# Patient Record
Sex: Male | Born: 1965 | Race: White | Hispanic: No | Marital: Single | Smoking: Current every day smoker
Health system: Southern US, Community
[De-identification: ages and names within clinical notes are randomized; demographics above are authoritative.]

## PROBLEM LIST (undated history)

## (undated) HISTORY — PX: HERNIA REPAIR: SHX51

---

## 2016-07-23 ENCOUNTER — Ambulatory Visit: Payer: Self-pay | Admitting: Adult Health

## 2016-08-06 ENCOUNTER — Encounter: Payer: Self-pay | Admitting: Adult Health

## 2016-08-06 ENCOUNTER — Ambulatory Visit (INDEPENDENT_AMBULATORY_CARE_PROVIDER_SITE_OTHER): Payer: BLUE CROSS/BLUE SHIELD | Admitting: Adult Health

## 2016-08-06 VITALS — BP 120/80 | HR 87 | Resp 17 | Ht 73.75 in | Wt 178.0 lb

## 2016-08-06 DIAGNOSIS — Z720 Tobacco use: Secondary | ICD-10-CM | POA: Diagnosis not present

## 2016-08-06 DIAGNOSIS — M5431 Sciatica, right side: Secondary | ICD-10-CM

## 2016-08-06 DIAGNOSIS — R51 Headache: Secondary | ICD-10-CM

## 2016-08-06 DIAGNOSIS — H9191 Unspecified hearing loss, right ear: Secondary | ICD-10-CM

## 2016-08-06 DIAGNOSIS — H9190 Unspecified hearing loss, unspecified ear: Secondary | ICD-10-CM | POA: Insufficient documentation

## 2016-08-06 DIAGNOSIS — R519 Headache, unspecified: Secondary | ICD-10-CM

## 2016-08-06 DIAGNOSIS — K089 Disorder of teeth and supporting structures, unspecified: Secondary | ICD-10-CM | POA: Insufficient documentation

## 2016-08-06 DIAGNOSIS — R5383 Other fatigue: Secondary | ICD-10-CM

## 2016-08-06 DIAGNOSIS — K4090 Unilateral inguinal hernia, without obstruction or gangrene, not specified as recurrent: Secondary | ICD-10-CM

## 2016-08-06 MED ORDER — DULOXETINE HCL 60 MG PO CPEP: 60.0000 mg | ORAL_CAPSULE | Freq: Every day | ORAL | 0 refills | Status: DC

## 2016-08-06 NOTE — Progress Notes (Addendum)
Subjective:    Patient ID: Steven Briggs, male    DOB: 01/09/1966, 51 y.o.   MRN: [id number]  HPI:  Steven Briggs presents to establish as a new pt.  He is a pleasant 51 year old that severd 21 years in the [employer] as a [employer].  Medical hx includes HA, hearing loss, poor dentition (he has personally extracted his own teeth), left inguinal hernia, chronic back pain and tobacco use (1/2 pack for last 42 years).  He denies mental health issue or ETOH use.  He has had various procedures/screening completed at [facility] in [place], however none since moving to [place] in 2014.   Patient Care Team    Relationship Specialty Notifications Start End  Malon KindleKaty D Bess, NP PCP - General Family Medicine  08/06/16     Patient Active Problem List   Diagnosis Date Noted  . Back pain with right-sided sciatica 08/06/2016  . Headache 08/06/2016  . Tobacco use 08/06/2016  . Left inguinal hernia 08/06/2016  . Hearing loss 08/06/2016  . Poor dentition 08/06/2016     History reviewed. No pertinent past medical history.   Past Surgical History:  Procedure Laterality Date  . HERNIA REPAIR     2009 and 2012     Family History  Problem Relation Age of Onset  . Alcohol abuse Mother   . Stroke Mother     in her late 2480's  . Heart attack Father     in his 3850's  . Hypertension Father      History  Drug Use No     History  Alcohol Use No     History  Smoking Status  . Current Every Day Smoker  . Packs/day: 0.50  . Years: 42.00  . Types: Cigarettes  Smokeless Tobacco  . Never Used     Outpatient Encounter Prescriptions as of 08/06/2016  Medication Sig  . Acetaminophen-Caffeine (EXCEDRIN TENSION HEADACHE) 500-65 MG TABS Take by mouth.  . DULoxetine (CYMBALTA) 60 MG capsule Take 1 capsule (60 mg total) by mouth daily. Half tablet by mouth the first 7 days, then increase to full tablet daily.   No facility-administered encounter medications on file as of 08/06/2016.      Allergies: Patient has no known allergies.  Body mass index is 23.01 kg/m.  Blood pressure 120/80, pulse 87, resp. rate 17, height 6' 1.75" (1.873 m), weight 178 lb (80.7 kg), SpO2 100 %.     Review of Systems  Constitutional: Negative for activity change, appetite change, chills, diaphoresis, fatigue, fever and unexpected weight change.  HENT: Positive for dental problem, hearing loss and tinnitus. Negative for congestion, ear pain, sinus pain, trouble swallowing and voice change.        Personally extracted several teeth bottom right jaw.  Eyes: Negative for photophobia, pain, discharge, redness, itching and visual disturbance.  Respiratory: Negative for cough and shortness of breath.   Cardiovascular: Negative for chest pain and leg swelling.  Gastrointestinal: Negative for abdominal distention, abdominal pain, blood in stool, constipation, diarrhea, nausea and vomiting.  Endocrine: Negative for cold intolerance, heat intolerance, polydipsia, polyphagia and polyuria.  Genitourinary: Negative for difficulty urinating and flank pain.  Musculoskeletal: Positive for arthralgias, back pain and myalgias. Negative for gait problem, joint swelling, neck pain and neck stiffness.  Skin: Negative for color change, pallor, rash and wound.  Neurological: Positive for headaches. Negative for dizziness, tremors, speech difficulty, weakness and light-headedness.  Psychiatric/Behavioral: Negative for agitation, behavioral problems, confusion, self-injury, sleep disturbance  and suicidal ideas. The patient is not nervous/anxious.        Objective:   Physical Exam  Constitutional: He is oriented to person, place, and time. He appears well-developed and well-nourished. No distress.  HENT:  Head: Normocephalic and atraumatic.  Right Ear: Tympanic membrane, external ear and ear canal normal. Decreased hearing is noted.  Left Ear: Hearing, tympanic membrane, external ear and ear canal normal. No  decreased hearing is noted.  Mouth/Throat: He does not have dentures. No oral lesions. No trismus in the jaw. Abnormal dentition. No dental abscesses, uvula swelling or lacerations.    Missing teeth bottom right jaw.  No signs of infection noted.  Eyes: Conjunctivae and EOM are normal. Pupils are equal, round, and reactive to light.  Neck: Normal range of motion. Neck supple. No spinous process tenderness and no muscular tenderness present.    Once firm, mobile lymph node-been there > 5 years.  Cardiovascular: Normal rate, regular rhythm, normal heart sounds and intact distal pulses.   Pulmonary/Chest: Effort normal and breath sounds normal. No respiratory distress. He has no wheezes. He has no rales. He exhibits no tenderness.  Abdominal: Soft. Bowel sounds are normal. He exhibits no distension. There is no tenderness. There is no rebound and no guarding. A hernia is present. Hernia confirmed positive in the left inguinal area.  Musculoskeletal: Normal range of motion. He exhibits tenderness.       Right hip: He exhibits tenderness.       Lumbar back: He exhibits tenderness.  Neurological: He is alert and oriented to person, place, and time. He has normal reflexes.  Skin: Skin is warm and dry. No rash noted. He is not diaphoretic. No erythema. No pallor.  Psychiatric: He has a normal mood and affect. His behavior is normal. Judgment and thought content normal.  Nursing note and vitals reviewed.         Assessment & Plan:   1. Fatigue, unspecified type   2. Back pain with right-sided sciatica   3. Chronic nonintractable headache, unspecified headache type   4. Tobacco use   5. Left inguinal hernia   6. Hearing loss of right ear, unspecified hearing loss type   7. Poor dentition     Back pain with right-sided sciatica Continue daily stretching and use good body mechanics when lifting.   Begin taking 1/2 tablet Cymbalta for 7 days, then increase to full tablet daily. Return in 4  weeks to evaluate effectiveness of medication.  Hearing loss Avoid exposure loud noises. Audiologist referral placed.  Headache Increase water intake, reduce tobacco use.  Continue Excedrin as directed by manufacturer.  Tobacco use Declined smoking cessation at this time.   Left inguinal hernia Use good body mechanics when lifting.   Urology referral placed.  Poor dentition Daily brushing and flossing.  Decline information in local dental practice at encounter today.    FOLLOW-UP:  Return in about 1 month (around 09/03/2016) for Evaluate Medication Effectiveness.

## 2016-08-06 NOTE — Assessment & Plan Note (Signed)
Daily brushing and flossing.  Decline information in local dental practice at encounter today.

## 2016-08-06 NOTE — Assessment & Plan Note (Signed)
Declined smoking cessation at this time.  

## 2016-08-06 NOTE — Patient Instructions (Signed)
Back Pain, Adult Back pain is very common in adults.The cause of back pain is rarely dangerous and the pain often gets better over time.The cause of your back pain may not be known. Some common causes of back pain include:  Strain of the muscles or ligaments supporting the spine.  Wear and tear (degeneration) of the spinal disks.  Arthritis.  Direct injury to the back. For many people, back pain may return. Since back pain is rarely dangerous, most people can learn to manage this condition on their own. Follow these instructions at home: Watch your back pain for any changes. The following actions may help to lessen any discomfort you are feeling:  Remain active. It is stressful on your back to sit or stand in one place for long periods of time. Do not sit, drive, or stand in one place for more than 30 minutes at a time. Take short walks on even surfaces as soon as you are able.Try to increase the length of time you walk each day.  Exercise regularly as directed by your health care provider. Exercise helps your back heal faster. It also helps avoid future injury by keeping your muscles strong and flexible.  Do not stay in bed.Resting more than 1-2 days can delay your recovery.  Pay attention to your body when you bend and lift. The most comfortable positions are those that put less stress on your recovering back. Always use proper lifting techniques, including:  Bending your knees.  Keeping the load close to your body.  Avoiding twisting.  Find a comfortable position to sleep. Use a firm mattress and lie on your side with your knees slightly bent. If you lie on your back, put a pillow under your knees.  Avoid feeling anxious or stressed.Stress increases muscle tension and can worsen back pain.It is important to recognize when you are anxious or stressed and learn ways to manage it, such as with exercise.  Take medicines only as directed by your health care provider.  Over-the-counter medicines to reduce pain and inflammation are often the most helpful.Your health care provider may prescribe muscle relaxant drugs.These medicines help dull your pain so you can more quickly return to your normal activities and healthy exercise.  Apply ice to the injured area:  Put ice in a plastic bag.  Place a towel between your skin and the bag.  Leave the ice on for 20 minutes, 2-3 times a day for the first 2-3 days. After that, ice and heat may be alternated to reduce pain and spasms.  Maintain a healthy weight. Excess weight puts extra stress on your back and makes it difficult to maintain good posture. Contact a health care provider if:  You have pain that is not relieved with rest or medicine.  You have increasing pain going down into the legs or buttocks.  You have pain that does not improve in one week.  You have night pain.  You lose weight.  You have a fever or chills. Get help right away if:  You develop new bowel or bladder control problems.  You have unusual weakness or numbness in your arms or legs.  You develop nausea or vomiting.  You develop abdominal pain.  You feel faint. This information is not intended to replace advice given to you by your health care provider. Make sure you discuss any questions you have with your health care provider. Document Released: 06/03/2005 Document Revised: 10/12/2015 Document Reviewed: 10/05/2013 Elsevier Interactive Patient Education  2017 [employer]   Inc[employer]  Duloxetine delayed-release capsules What is this medicine? DULOXETINE (doo LOX e teen) is used to treat depression, anxiety, and different types of chronic pain. This medicine may be used for other purposes; ask your health care provider or pharmacist if you have questions. COMMON BRAND NAME(S): Cymbalta, [place] What should I tell my health care provider before I take this medicine? They need to know if you have any of these conditions: -bipolar  disorder or a family history of bipolar disorder -glaucoma -kidney disease -liver disease -suicidal thoughts or a previous suicide attempt -taken medicines called MAOIs like Carbex, Eldepryl, Marplan, Nardil, and Parnate within 14 days -an unusual reaction to duloxetine, other medicines, foods, dyes, or preservatives -pregnant or trying to get pregnant -breast-feeding How should I use this medicine? Take this medicine by mouth with a glass of water. Follow the directions on the prescription label. Do not cut, crush or chew this medicine. You can take this medicine with or without food. Take your medicine at regular intervals. Do not take your medicine more often than directed. Do not stop taking this medicine suddenly except upon the advice of your doctor. Stopping this medicine too quickly may cause serious side effects or your condition may worsen. A special MedGuide will be given to you by the pharmacist with each prescription and refill. Be sure to read this information carefully each time. Talk to your pediatrician regarding the use of this medicine in children. While this drug may be prescribed for children as young as 61 years of age for selected conditions, precautions do apply. Overdosage: If you think you have taken too much of this medicine contact a poison control center or emergency room at once. NOTE: This medicine is only for you. Do not share this medicine with others. What if I miss a dose? If you miss a dose, take it as soon as you can. If it is almost time for your next dose, take only that dose. Do not take double or extra doses. What may interact with this medicine? Do not take this medicine with any of the following medications: -desvenlafaxine -levomilnacipran -linezolid -MAOIs like Carbex, Eldepryl, Marplan, Nardil, and Parnate -methylene blue (injected into a vein) -milnacipran -thioridazine -venlafaxine This medicine may also interact with the following  medications: -alcohol -amphetamines -aspirin and aspirin-like medicines -certain antibiotics like ciprofloxacin and enoxacin -certain medicines for blood pressure, heart disease, irregular heart beat -certain medicines for depression, anxiety, or psychotic disturbances -certain medicines for migraine headache like almotriptan, eletriptan, frovatriptan, naratriptan, rizatriptan, sumatriptan, zolmitriptan -certain medicines that treat or prevent blood clots like warfarin, enoxaparin, and dalteparin -cimetidine -fentanyl -lithium -NSAIDS, medicines for pain and inflammation, like ibuprofen or naproxen -phentermine -procarbazine -rasagiline -sibutramine -St. John's wort -theophylline -tramadol -tryptophan This list may not describe all possible interactions. Give your health care provider a list of all the medicines, herbs, non-prescription drugs, or dietary supplements you use. Also tell them if you smoke, drink alcohol, or use illegal drugs. Some items may interact with your medicine. What should I watch for while using this medicine? Tell your doctor if your symptoms do not get better or if they get worse. Visit your doctor or health care professional for regular checks on your progress. Because it may take several weeks to see the full effects of this medicine, it is important to continue your treatment as prescribed by your doctor. Patients and their families should watch out for new or worsening thoughts of suicide or depression. Also watch out for sudden  changes in feelings such as feeling anxious, agitated, panicky, irritable, hostile, aggressive, impulsive, severely restless, overly excited and hyperactive, or not being able to sleep. If this happens, especially at the beginning of treatment or after a change in dose, call your health care professional. Bonita Quin may get drowsy or dizzy. Do not drive, use machinery, or do anything that needs mental alertness until you know how this medicine  affects you. Do not stand or sit up quickly, especially if you are an older patient. This reduces the risk of dizzy or fainting spells. Alcohol may interfere with the effect of this medicine. Avoid alcoholic drinks. This medicine can cause an increase in blood pressure. This medicine can also cause a sudden drop in your blood pressure, which may make you feel faint and increase the chance of a fall. These effects are most common when you first start the medicine or when the dose is increased, or during use of other medicines that can cause a sudden drop in blood pressure. Check with your doctor for instructions on monitoring your blood pressure while taking this medicine. Your mouth may get dry. Chewing sugarless gum or sucking hard candy, and drinking plenty of water may help. Contact your doctor if the problem does not go away or is severe. What side effects may I notice from receiving this medicine? Side effects that you should report to your doctor or health care professional as soon as possible: -allergic reactions like skin rash, itching or hives, swelling of the face, lips, or tongue -anxious -breathing problems -confusion -changes in vision -chest pain -confusion -elevated mood, decreased need for sleep, racing thoughts, impulsive behavior -eye pain -fast, irregular heartbeat -feeling faint or lightheaded, falls -feeling agitated, angry, or irritable -hallucination, loss of contact with reality -high blood pressure -loss of balance or coordination -palpitations -redness, blistering, peeling or loosening of the skin, including inside the mouth -restlessness, pacing, inability to keep still -seizures -stiff muscles -suicidal thoughts or other mood changes -trouble passing urine or change in the amount of urine -trouble sleeping -unusual bleeding or bruising -unusually weak or tired -vomiting -yellowing of the eyes or skin Side effects that usually do not require medical  attention (report to your doctor or health care professional if they continue or are bothersome): -change in sex drive or performance -change in appetite or weight -constipation -dizziness -dry mouth -headache -increased sweating -nausea -tired This list may not describe all possible side effects. Call your doctor for medical advice about side effects. You may report side effects to FDA at 1-800-FDA-1088. Where should I keep my medicine? Keep out of the reach of children. Store at room temperature between 20 and 25 degrees C (68 to 77 degrees F). Throw away any unused medicine after the expiration date. NOTE: This sheet is a summary. It may not cover all possible information. If you have questions about this medicine, talk to your doctor, pharmacist, or health care provider.  2017 Elsevier/Gold Standard (2015-11-02 18:16:03)   General Headache Without Cause A headache is pain or discomfort felt around the head or neck area. The specific cause of a headache may not be found. There are many causes and types of headaches. A few common ones are:  Tension headaches.  Migraine headaches.  Cluster headaches.  Chronic daily headaches. Follow these instructions at home: Watch your condition for any changes. Take these steps to help with your condition: Managing pain  Take over-the-counter and prescription medicines only as told by your health care provider.  Lie down in a dark, quiet room when you have a headache.  If directed, apply ice to the head and neck area:  Put ice in a plastic bag.  Place a towel between your skin and the bag.  Leave the ice on for 20 minutes, 2-3 times per day.  Use a heating pad or hot shower to apply heat to the head and neck area as told by your health care provider.  Keep lights dim if bright lights bother you or make your headaches worse. Eating and drinking  Eat meals on a regular schedule.  Limit alcohol use.  Decrease the amount of  caffeine you drink, or stop drinking caffeine. General instructions  Keep all follow-up visits as told by your health care provider. This is important.  Keep a headache journal to help find out what may trigger your headaches. For example, write down:  What you eat and drink.  How much sleep you get.  Any change to your diet or medicines.  Try massage or other relaxation techniques.  Limit stress.  Sit up straight, and do not tense your muscles.  Do not use tobacco products, including cigarettes, chewing tobacco, or e-cigarettes. If you need help quitting, ask your health care provider.  Exercise regularly as told by your health care provider.  Sleep on a regular schedule. Get 7-9 hours of sleep, or the amount recommended by your health care provider. Contact a health care provider if:  Your symptoms are not helped by medicine.  You have a headache that is different from the usual headache.  You have nausea or you vomit.  You have a fever. Get help right away if:  Your headache becomes severe.  You have repeated vomiting.  You have a stiff neck.  You have a loss of vision.  You have problems with speech.  You have pain in the eye or ear.  You have muscular weakness or loss of muscle control.  You lose your balance or have trouble walking.  You feel faint or pass out.  You have confusion. This information is not intended to replace advice given to you by your health care provider. Make sure you discuss any questions you have with your health care provider. Document Released: 06/03/2005 Document Revised: 11/09/2015 Document Reviewed: 09/26/2014 Elsevier Interactive Patient Education  2017 [employer] Inc.   Tobacco Use Disorder Tobacco use disorder (TUD) is a mental disorder. It is the long-term use of tobacco in spite of related health problems or difficulty with normal life activities. Tobacco is most commonly smoked as cigarettes and less commonly as cigars  or pipes. Smokeless chewing tobacco and snuff are also popular. People with TUD get a feeling of extreme pleasure (euphoria) from using tobacco and have a desire to use it again and again. Repeated use of tobacco can cause problems. The addictive effects of tobacco are due mainly tothe ingredient nicotine. Nicotine also causes a rush of adrenaline (epinephrine) in the body. This leads to increased blood pressure, heart rate, and breathing rate. These changes may cause problems for people with high blood pressure, weak hearts, or lung disease. High doses of nicotine in children and pets can lead to seizures and death. Tobacco contains a number of other unsafe chemicals. These chemicals are especially harmful when inhaled as smoke and can damage almost every organ in the body. Smokers live shorter lives than nonsmokers and are at risk of dying from a number of diseases and cancers. Tobacco smoke can also cause health  problems for nonsmokers (due to inhaling secondhand smoke). Smoking is also a fire hazard. TUD usually starts in the late teenage years and is most common in young adults between the ages of 76 and 25 years. People who start smoking earlier in life are more likely to continue smoking as adults. TUD is somewhat more common in men than women. People with TUD are at higher risk for using alcohol and other drugs of abuse. What increases the risk? Risk factors for TUD include:  Having family members with the disorder.  Being around people who use tobacco.  Having an existing mental health issue such as schizophrenia, depression, bipolar disorder, ADHD, or posttraumatic stress disorder (PTSD). What are the signs or symptoms? People with tobacco use disorder have two or more of the following signs and symptoms within 12 months:  Use of more tobacco over a longer period than intended.  Not able to cut down or control tobacco use.  A lot of time spent obtaining or using tobacco.  Strong  desire or urge to use tobacco (craving). Cravings may last for 6 months or longer after quitting.  Use of tobacco even when use leads to major problems at work, school, or home.  Use of tobacco even when use leads to relationship problems.  Giving up or cutting down on important life activities because of tobacco use.  Repeatedly using tobacco in situations where it puts you or others in physical danger, like smoking in bed.  Use of tobacco even when it is known that a physical or mental problem is likely related to tobacco use.  Physical problems are numerous and may include chronic bronchitis, emphysema, lung and other cancers, gum disease, high blood pressure, heart disease, and stroke.  Mental problems caused by tobacco may include difficulty sleeping and anxiety.  Need to use greater amounts of tobacco to get the same effect. This means you have developed a tolerance.  Withdrawal symptoms as a result of stopping or rapidly cutting back use. These symptoms may last a month or more after quitting and include the following:  Depressed, anxious, or irritable mood.  Difficulty concentrating.  Increased appetite.  Restlessness or trouble sleeping.  Use of tobacco to avoid withdrawal symptoms. How is this diagnosed? Tobacco use disorder is diagnosed by your health care provider. A diagnosis may be made by:  Your health care provider asking questions about your tobacco use and any problems it may be causing.  A physical exam.  Lab tests.  You may be referred to a mental health professional or addiction specialist. The severity of tobacco use disorder depends on the number of signs and symptoms you have:  Mild-Two or three symptoms.  Moderate-Four or five symptoms.  Severe-Six or more symptoms. How is this treated? Many people with tobacco use disorder are unable to quit on their own and need help. Treatment options include the following:  Nicotine replacement therapy  (NRT). NRT provides nicotine without the other harmful chemicals in tobacco. NRT gradually lowers the dosage of nicotine in the body and reduces withdrawal symptoms. NRT is available in over-the-counter forms (gum, lozenges, and skin patches) as well as prescription forms (mouth inhaler and nasal spray).  Medicines.This may include:  Antidepressant medicine that may reduce nicotine cravings.  A medicine that acts on nicotine receptors in the brain to reduce cravings and withdrawal symptoms. It may also block the effects of tobacco in people with TUD who relapse.  Counseling or talk therapy. A form of talk therapy  called behavioral therapy is commonly used to treat people with TUD. Behavioral therapy looks at triggers for tobacco use, how to avoid them, and how to cope with cravings. It is most effective in person or by phone but is also available in self-help forms (books and Internet websites).  Support groups. These provide emotional support, advice, and guidance for quitting tobacco. The most effective treatment for TUD is usually a combination of medicine, talk therapy, and support groups. Follow these instructions at home:  Keep all follow-up visits as directed by your health care provider. This is important.  Take medicines only as directed by your health care provider.  Check with your health care provider before starting new prescription or over-the-counter medicines. Contact a health care provider if:  You are not able to take your medicines as prescribed.  Treatment is not helping your TUD and your symptoms get worse. Get help right away if:  You have serious thoughts about hurting yourself or others.  You have trouble breathing, chest pain, sudden weakness, or sudden numbness in part of your body. This information is not intended to replace advice given to you by your health care provider. Make sure you discuss any questions you have with your health care provider. Document  Released: 02/07/2004 Document Revised: 02/04/2016 Document Reviewed: 07/30/2013 Elsevier Interactive Patient Education  2017 [employer] Inc.  Urology referral placed to address left inguinal hernia. Audiology referral placed to address chronic hearing loss. Continue stretching daily and use Cymbalta as directed. Please call us with results of Biometric screening from employer. Please return in 4 weeks to evaluate effectiveness of Cymbalta.

## 2016-08-06 NOTE — Assessment & Plan Note (Signed)
Increase water intake, reduce tobacco use.  Continue Excedrin as directed by manufacturer.

## 2016-08-06 NOTE — Assessment & Plan Note (Signed)
Use good body mechanics when lifting.   Urology referral placed.

## 2016-08-06 NOTE — Assessment & Plan Note (Signed)
Avoid exposure loud noises. Audiologist referral placed.

## 2016-08-06 NOTE — Assessment & Plan Note (Signed)
Continue daily stretching and use good body mechanics when lifting.   Begin taking 1/2 tablet Cymbalta for 7 days, then increase to full tablet daily. Return in 4 weeks to evaluate effectiveness of medication.

## 2016-08-07 LAB — CBC WITH DIFFERENTIAL/PLATELET
BASOS ABS: 0 10*3/uL (ref 0.0–0.2)
Basos: 0 %
EOS (ABSOLUTE): 0.1 10*3/uL (ref 0.0–0.4)
Eos: 2 %
HEMOGLOBIN: 14.7 g/dL (ref 13.0–17.7)
Hematocrit: 42.4 % (ref 37.5–51.0)
IMMATURE GRANS (ABS): 0 10*3/uL (ref 0.0–0.1)
Immature Granulocytes: 0 %
LYMPHS: 20 %
Lymphocytes Absolute: 1.6 10*3/uL (ref 0.7–3.1)
MCH: 30.1 pg (ref 26.6–33.0)
MCHC: 34.7 g/dL (ref 31.5–35.7)
MCV: 87 fL (ref 79–97)
MONOCYTES: 7 %
Monocytes Absolute: 0.6 10*3/uL (ref 0.1–0.9)
Neutrophils Absolute: 5.8 10*3/uL (ref 1.4–7.0)
Neutrophils: 71 %
Platelets: 224 10*3/uL (ref 150–379)
RBC: 4.88 x10E6/uL (ref 4.14–5.80)
RDW: 13 % (ref 12.3–15.4)
WBC: 8.2 10*3/uL (ref 3.4–10.8)

## 2016-08-07 LAB — BASIC METABOLIC PANEL
BUN/Creatinine Ratio: 15 (ref 9–20)
BUN: 11 mg/dL (ref 6–24)
CALCIUM: 9 mg/dL (ref 8.7–10.2)
CHLORIDE: 108 mmol/L — AB (ref 96–106)
CO2: 23 mmol/L (ref 18–29)
Creatinine, Ser: 0.72 mg/dL — ABNORMAL LOW (ref 0.76–1.27)
GFR calc non Af Amer: 109 (ref >59)
GFR, EST AFRICAN AMERICAN: 126 (ref >59)
GLUCOSE: 80 mg/dL
Glucose: 75 mg/dL (ref 65–99)
Potassium: 4.5 mmol/L (ref 3.5–5.2)
Sodium: 141 mmol/L (ref 134–144)

## 2016-08-07 LAB — LIPID PANEL
Cholesterol: 133 mg/dL (ref 0–200)
HDL: 35 mg/dL (ref 35–70)
LDL Cholesterol: 77 mg/dL
Triglycerides: 103 mg/dL (ref 40–160)

## 2016-08-08 LAB — VITAMIN D 25 HYDROXY (VIT D DEFICIENCY, FRACTURES): VIT D 25 HYDROXY: 19.3 ng/mL — AB (ref 30.0–100.0)

## 2016-08-08 LAB — SPECIMEN STATUS REPORT

## 2016-08-12 ENCOUNTER — Other Ambulatory Visit: Payer: Self-pay | Admitting: Adult Health

## 2016-08-12 DIAGNOSIS — E559 Vitamin D deficiency, unspecified: Secondary | ICD-10-CM

## 2016-08-12 MED ORDER — VITAMIN D (ERGOCALCIFEROL) 1.25 MG (50000 UNIT) PO CAPS: 50000.0000 [IU] | ORAL_CAPSULE | ORAL | 0 refills | Status: DC

## 2016-08-12 NOTE — Progress Notes (Unsigned)
Rx Ergocalciferol for 16 weeks, then will re-check Vit d level.

## 2016-09-03 ENCOUNTER — Ambulatory Visit (INDEPENDENT_AMBULATORY_CARE_PROVIDER_SITE_OTHER): Payer: BLUE CROSS/BLUE SHIELD | Admitting: Adult Health

## 2016-09-03 ENCOUNTER — Encounter: Payer: Self-pay | Admitting: Adult Health

## 2016-09-03 VITALS — BP 115/77 | HR 85 | Ht 76.0 in | Wt 179.0 lb

## 2016-09-03 DIAGNOSIS — M5431 Sciatica, right side: Secondary | ICD-10-CM

## 2016-09-03 MED ORDER — CYCLOBENZAPRINE HCL 10 MG PO TABS: 10.0000 mg | ORAL_TABLET | Freq: Every day | ORAL | 1 refills | Status: DC

## 2016-09-03 NOTE — Patient Instructions (Signed)

## 2016-09-03 NOTE — Assessment & Plan Note (Signed)
Wean off Duloxetine since it was not effective for pain control, take one tablet every other day until bottle empty. Continue daily stretching, hot baths. Cyclobenzaprine as needed QHS. RTC PRN.

## 2016-09-03 NOTE — Progress Notes (Signed)
Subjective:    Patient ID: Steven Briggs, male    DOB: 10/10/1965, 51 y.o.   MRN: [id number]  HPI:  [name] presents for f/u on Cymbalta treatment for chronic pain.  He reports taking the medication as directed and has not experienced any real reduction in overall pain.  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  His work partner is out on medical leave and he has "working all the job sites by myself" which increases his fatigue and chronic back pain.  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 He reports stretching BID, hot baths in evening and "sleeping on a heating pad".  He denies bowel/bladder dysfunction.  He was unable to keep his general surgery appt for hernia last week b/c he was unable to get off work (since his partner is out on medical leave).     Patient Care Team    Relationship Specialty Notifications Start End  Malon KindleKaty D Bess, NP PCP - General Family Medicine  08/06/16     Patient Active Problem List   Diagnosis Date Noted  . Back pain with right-sided sciatica 08/06/2016  . Headache 08/06/2016  . Tobacco use 08/06/2016  . Left inguinal hernia 08/06/2016  . Hearing loss 08/06/2016  . Poor dentition 08/06/2016     No past medical history on file.   Past Surgical History:  Procedure Laterality Date  . HERNIA REPAIR     2009 and 2012     Family History  Problem Relation Age of Onset  . Alcohol abuse Mother   . Stroke Mother     in her late 4480's  . Heart attack Father     in his 9050's  . Hypertension Father      History  Drug Use No     History  Alcohol Use No     History  Smoking Status  . Current Every Day Smoker  . Packs/day: 0.50  . Years: 42.00  . Types: Cigarettes  Smokeless Tobacco  . Never Used     Outpatient Encounter Prescriptions as of 09/03/2016   Medication Sig  . Acetaminophen-Caffeine (EXCEDRIN TENSION HEADACHE) 500-65 MG TABS Take by mouth.  . Vitamin D, Ergocalciferol, (DRISDOL) 50000 units CAPS capsule Take 1 capsule (50,000 Units total) by mouth every 7 (seven) days.  . [DISCONTINUED] DULoxetine (CYMBALTA) 60 MG capsule Take 1 capsule (60 mg total) by mouth daily. Half tablet by mouth the first 7 days, then increase to full tablet daily.  . cyclobenzaprine (FLEXERIL) 10 MG tablet Take 1 tablet (10 mg total) by mouth at bedtime. As needed for pain/muscle spasm.   No facility-administered encounter medications on file as of 09/03/2016.     Allergies: Patient has no known allergies.  Body mass index is 21.79 kg/m.  Blood pressure 115/77, pulse 85, height 6\' 4"  (1.93 m), weight 179 lb 0.6 oz (81.2 kg), SpO2 97 %.     Review of Systems  Constitutional: Positive for fatigue. Negative for activity change, appetite change, chills, diaphoresis, fever and unexpected weight change.  Respiratory: Negative for cough, chest tightness, shortness of breath, wheezing and stridor.        Continues to use tobacco daily.  Cardiovascular: Negative for chest pain, palpitations and leg swelling.  Gastrointestinal: Negative for abdominal distention, abdominal pain, blood in stool, constipation, diarrhea, nausea and  vomiting.  Endocrine: Negative for cold intolerance, heat intolerance, polydipsia, polyphagia and polyuria.  Genitourinary: Negative for difficulty urinating and flank pain.  Musculoskeletal: Positive for arthralgias, back pain, myalgias and neck stiffness. Negative for gait problem, joint swelling and neck pain.  Skin: Negative for color change, pallor, rash and wound.  Neurological: Negative for dizziness, tremors, weakness and headaches.  Hematological: Does not bruise/bleed easily.  Psychiatric/Behavioral: Positive for sleep disturbance.       Reports early waking.       Objective:   Physical Exam  Constitutional: He is  oriented to person, place, and time. He appears well-developed and well-nourished.  HENT:  Head: Normocephalic and atraumatic.  Right Ear: External ear normal.  Left Ear: External ear normal.  Eyes: Conjunctivae are normal. Pupils are equal, round, and reactive to light.  Neck: Normal range of motion. Neck supple.  Cardiovascular: Normal rate, regular rhythm, normal heart sounds and intact distal pulses.   Pulmonary/Chest: Effort normal and breath sounds normal. No respiratory distress. He has no wheezes. He has no rales. He exhibits no tenderness.  Musculoskeletal: Normal range of motion. He exhibits tenderness.       Thoracic back: He exhibits tenderness and spasm. He exhibits no edema and no deformity.       Lumbar back: He exhibits tenderness and pain.  Right lumbar more painful than left lumbar. Full flexion/extension/rotation of lumbar/thoracic back.  Lymphadenopathy:    He has no cervical adenopathy.  Neurological: He is alert and oriented to person, place, and time.  Skin: Skin is warm and dry. No rash noted. No erythema. No pallor.  Psychiatric: He has a normal mood and affect. His behavior is normal. Judgment and thought content normal.  Nursing note and vitals reviewed.         Assessment & Plan:   1. Back pain with right-sided sciatica     Back pain with right-sided sciatica Wean off Duloxetine since it was not effective for pain control, take one tablet every other day until bottle empty. Continue daily stretching, hot baths. Cyclobenzaprine as needed QHS. RTC PRN.    FOLLOW-UP:  Return if symptoms worsen or fail to improve.

## 2016-10-29 ENCOUNTER — Encounter: Payer: Self-pay | Admitting: Adult Health

## 2016-10-29 ENCOUNTER — Ambulatory Visit (INDEPENDENT_AMBULATORY_CARE_PROVIDER_SITE_OTHER): Payer: BLUE CROSS/BLUE SHIELD | Admitting: Adult Health

## 2016-10-29 VITALS — BP 127/82 | HR 80 | Temp 97.7°F | Ht 73.0 in | Wt 173.0 lb

## 2016-10-29 DIAGNOSIS — M5431 Sciatica, right side: Secondary | ICD-10-CM

## 2016-10-29 DIAGNOSIS — K4090 Unilateral inguinal hernia, without obstruction or gangrene, not specified as recurrent: Secondary | ICD-10-CM | POA: Diagnosis not present

## 2016-10-29 MED ORDER — HYDROCODONE-ACETAMINOPHEN 7.5-325 MG PO TABS: 1.0000 | ORAL_TABLET | Freq: Three times a day (TID) | ORAL | 0 refills | Status: DC | PRN

## 2016-10-29 MED ORDER — CYCLOBENZAPRINE HCL 10 MG PO TABS: 10.0000 mg | ORAL_TABLET | Freq: Every day | ORAL | 0 refills | Status: DC
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
## 2016-10-29 NOTE — Assessment & Plan Note (Signed)
Has seen surgeon and plans to have L inguinal hernia repair beginning of July 2018.

## 2016-10-29 NOTE — Patient Instructions (Signed)
Back Pain, Adult Back pain is very common in adults.The cause of back pain is rarely dangerous and the pain often gets better over time.The cause of your back pain may not be known. Some common causes of back pain include:  Strain of the muscles or ligaments supporting the spine.  Wear and tear (degeneration) of the spinal disks.  Arthritis.  Direct injury to the back. For many people, back pain may return. Since back pain is rarely dangerous, most people can learn to manage this condition on their own. Follow these instructions at home: Watch your back pain for any changes. The following actions may help to lessen any discomfort you are feeling:  Remain active. It is stressful on your back to sit or stand in one place for long periods of time. Do not sit, drive, or stand in one place for more than 30 minutes at a time. Take short walks on even surfaces as soon as you are able.Try to increase the length of time you walk each day.  Exercise regularly as directed by your health care provider. Exercise helps your back heal faster. It also helps avoid future injury by keeping your muscles strong and flexible.  Do not stay in bed.Resting more than 1-2 days can delay your recovery.  Pay attention to your body when you bend and lift. The most comfortable positions are those that put less stress on your recovering back. Always use proper lifting techniques, including:  Bending your knees.  Keeping the load close to your body.  Avoiding twisting.  Find a comfortable position to sleep. Use a firm mattress and lie on your side with your knees slightly bent. If you lie on your back, put a pillow under your knees.  Avoid feeling anxious or stressed.Stress increases muscle tension and can worsen back pain.It is important to recognize when you are anxious or stressed and learn ways to manage it, such as with exercise.  Take medicines only as directed by your health care provider.  Over-the-counter medicines to reduce pain and inflammation are often the most helpful.Your health care provider may prescribe muscle relaxant drugs.These medicines help dull your pain so you can more quickly return to your normal activities and healthy exercise.  Apply ice to the injured area:  Put ice in a plastic bag.  Place a towel between your skin and the bag.  Leave the ice on for 20 minutes, 2-3 times a day for the first 2-3 days. After that, ice and heat may be alternated to reduce pain and spasms.  Maintain a healthy weight. Excess weight puts extra stress on your back and makes it difficult to maintain good posture. Contact a health care provider if:  You have pain that is not relieved with rest or medicine.  You have increasing pain going down into the legs or buttocks.  You have pain that does not improve in one week.  You have night pain.  You lose weight.  You have a fever or chills. Get help right away if:  You develop new bowel or bladder control problems.  You have unusual weakness or numbness in your arms or legs.  You develop nausea or vomiting.  You develop abdominal pain.  You feel faint. This information is not intended to replace advice given to you by your health care provider. Make sure you discuss any questions you have with your health care provider. Document Released: 06/03/2005 Document Revised: 10/12/2015 Document Reviewed: 10/05/2013 Elsevier Interactive Patient Education  2017 Elsevier   Inc.    Back Exercises The following exercises strengthen the muscles that help to support the back. They also help to keep the lower back flexible. Doing these exercises can help to prevent back pain or lessen existing pain. If you have back pain or discomfort, try doing these exercises 2-3 times each day or as told by your health care provider. When the pain goes away, do them once each day, but increase the number of times that you repeat the steps for  each exercise (do more repetitions). If you do not have back pain or discomfort, do these exercises once each day or as told by your health care provider. Exercises Single Knee to Chest   Repeat these steps 3-5 times for each leg: 1. Lie on your back on a firm bed or the floor with your legs extended. 2. Bring one knee to your chest. Your other leg should stay extended and in contact with the floor. 3. Hold your knee in place by grabbing your knee or thigh. 4. Pull on your knee until you feel a gentle stretch in your lower back. 5. Hold the stretch for 10-30 seconds. 6. Slowly release and straighten your leg. Pelvic Tilt   Repeat these steps 5-10 times: 1. Lie on your back on a firm bed or the floor with your legs extended. 2. Bend your knees so they are pointing toward the ceiling and your feet are flat on the floor. 3. Tighten your lower abdominal muscles to press your lower back against the floor. This motion will tilt your pelvis so your tailbone points up toward the ceiling instead of pointing to your feet or the floor. 4. With gentle tension and even breathing, hold this position for 5-10 seconds. Cat-Cow   Repeat these steps until your lower back becomes more flexible: 1. Get into a hands-and-knees position on a firm surface. Keep your hands under your shoulders, and keep your knees under your hips. You may place padding under your knees for comfort. 2. Let your head hang down, and point your tailbone toward the floor so your lower back becomes rounded like the back of a cat. 3. Hold this position for 5 seconds. 4. Slowly lift your head and point your tailbone up toward the ceiling so your back forms a sagging arch like the back of a cow. 5. Hold this position for 5 seconds. Press-Ups   Repeat these steps 5-10 times: 1. Lie on your abdomen (face-down) on the floor. 2. Place your palms near your head, about shoulder-width apart. 3. While you keep your back as relaxed as possible  and keep your hips on the floor, slowly straighten your arms to raise the top half of your body and lift your shoulders. Do not use your back muscles to raise your upper torso. You may adjust the placement of your hands to make yourself more comfortable. 4. Hold this position for 5 seconds while you keep your back relaxed. 5. Slowly return to lying flat on the floor. Bridges   Repeat these steps 10 times: 1. Lie on your back on a firm surface. 2. Bend your knees so they are pointing toward the ceiling and your feet are flat on the floor. 3. Tighten your buttocks muscles and lift your buttocks off of the floor until your waist is at almost the same height as your knees. You should feel the muscles working in your buttocks and the back of your thighs. If you do not feel these muscles, slide  your feet 1-2 inches farther away from your buttocks. 4. Hold this position for 3-5 seconds. 5. Slowly lower your hips to the starting position, and allow your buttocks muscles to relax completely. If this exercise is too easy, try doing it with your arms crossed over your chest. Abdominal Crunches   Repeat these steps 5-10 times: 1. Lie on your back on a firm bed or the floor with your legs extended. 2. Bend your knees so they are pointing toward the ceiling and your feet are flat on the floor. 3. Cross your arms over your chest. 4. Tip your chin slightly toward your chest without bending your neck. 5. Tighten your abdominal muscles and slowly raise your trunk (torso) high enough to lift your shoulder blades a tiny bit off of the floor. Avoid raising your torso higher than that, because it can put too much stress on your low back and it does not help to strengthen your abdominal muscles. 6. Slowly return to your starting position. Back Lifts  Repeat these steps 5-10 times: 1. Lie on your abdomen (face-down) with your arms at your sides, and rest your forehead on the floor. 2. Tighten the muscles in your  legs and your buttocks. 3. Slowly lift your chest off of the floor while you keep your hips pressed to the floor. Keep the back of your head in line with the curve in your back. Your eyes should be looking at the floor. 4. Hold this position for 3-5 seconds. 5. Slowly return to your starting position. Contact a health care provider if:  Your back pain or discomfort gets much worse when you do an exercise.  Your back pain or discomfort does not lessen within 2 hours after you exercise. If you have any of these problems, stop doing these exercises right away. Do not do them again unless your health care provider says that you can. Get help right away if:  You develop sudden, severe back pain. If this happens, stop doing the exercises right away. Do not do them again unless your health care provider says that you can. This information is not intended to replace advice given to you by your health care provider. Make sure you discuss any questions you have with your health care provider. Document Released: 07/11/2004 Document Revised: 10/11/2015 Document Reviewed: 07/28/2014 Elsevier Interactive Patient Education  2017 [employer]  Take medications as directed. Instructed not to take Norco and Cyclobenzaprine together. Do not drive or drink alcohol when taking Norco. PT and Orthopedic referrals placed. Continue daily stretching, heating pad, and warm baths. Continue to use good body mechanics when lifting. Please call clinic with any questions/concerns.

## 2016-10-29 NOTE — Assessment & Plan Note (Addendum)
Short rx of Norco provided. DEA registry reviewed, no contraindications noted. Cyclobenzaprine refilled.  Do not drive or drink alcohol when taking Norco.  PT and Orthopedic referrals placed. Continue daily stretching, heating pad, and warm baths. Continue to use good body mechanics when lifting. Please call clinic with any questions/concerns.

## 2016-11-19 ENCOUNTER — Other Ambulatory Visit: Payer: Self-pay | Admitting: Adult Health

## 2016-11-19 MED ORDER — VITAMIN D (ERGOCALCIFEROL) 1.25 MG (50000 UNIT) PO CAPS: 50000.0000 [IU] | ORAL_CAPSULE | ORAL | 0 refills | Status: DC

## 2016-11-19 NOTE — Telephone Encounter (Signed)
Please review refill requests for approval.  Tiajuana Amass. Kasiya Burck, CMA

## 2016-11-19 NOTE — Telephone Encounter (Signed)
Patient is requesting a refill of the Flexeril, Norco, and Vit D sent to [facility].

## 2016-11-19 NOTE — Addendum Note (Signed)
Addended by: Stan HeadNELSON, TONYA S on: 11/19/2016 03:16 PM   Modules accepted: Orders

## 2016-11-19 NOTE — Telephone Encounter (Signed)
He has not completed ordered Xray, not been to see PT or orthopedic. RFs will not be provided for cyclobenzaprine or norco.

## 2016-11-20 NOTE — Telephone Encounter (Signed)
Pt informed.  Advised pt, per William HamburgerKaty Danford, that it is our office policy that we will only give one RX for narcotics EVER.  Advised pt that he needs to have xray completed and see ortho for evaluation.  Any further RXs for narcotics should be prescribed by ortho.  Pt expressed understanding.  Steven Briggs. Nelson, CMA

## 2016-12-17 ENCOUNTER — Ambulatory Visit (INDEPENDENT_AMBULATORY_CARE_PROVIDER_SITE_OTHER): Payer: Self-pay

## 2016-12-17 ENCOUNTER — Encounter (INDEPENDENT_AMBULATORY_CARE_PROVIDER_SITE_OTHER): Payer: Self-pay | Admitting: Orthopaedic Surgery

## 2016-12-17 ENCOUNTER — Ambulatory Visit (INDEPENDENT_AMBULATORY_CARE_PROVIDER_SITE_OTHER): Payer: BLUE CROSS/BLUE SHIELD | Admitting: Orthopaedic Surgery

## 2016-12-17 VITALS — BP 122/81 | HR 85 | Ht 76.0 in | Wt 166.0 lb

## 2016-12-17 DIAGNOSIS — G8929 Other chronic pain: Secondary | ICD-10-CM | POA: Diagnosis not present

## 2016-12-17 DIAGNOSIS — M5441 Lumbago with sciatica, right side: Secondary | ICD-10-CM

## 2016-12-17 NOTE — Progress Notes (Signed)
Office Visit Note   Patient: Steven Briggs           Date of Birth: 01/22/1966           MRN: [id number] Visit Date: 12/17/2016              Requested by: Julaine Fusianford, Katy D, NP [address] [place], [place] [id number] PCP: Julaine Fusianford, Katy D, NP   Assessment & Plan: Visit Diagnoses:  1. Chronic midline low back pain with right-sided sciatica     Plan: Patient had persistent symptoms for a year. He is physically active and in good shape with good muscle strength and tone but has persistent nerve root tension signs on the right side. I recommend proceeding with an MRI scan for evaluation due to sciatic notch tenderness positive straight leg raising on the right and also follow-up after MRI.  Follow-Up Instructions: No Follow-up on file.   Orders:  Orders Placed This Encounter  Procedures  . XR Lumbar Spine 2-3 Views   No orders of the defined types were placed in this encounter.     Procedures: No procedures performed   Clinical Data: No additional findings.   Subjective: Chief Complaint  Patient presents with  . Lower Back - Pain    HPI 51 year old male works for [employer] [place]. 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 He states he does this repetitively and did 74 in the last 3 months ended 11 last week. He states he has pain in his back was sitting originally was injured at work in July 2017. He's a continued symptoms has had plain radiographs no MRI. He does better when he standing. His pain gradually progresses during the day and he has pain that radiates from his right side into his right thigh stops laterally at the knee. He's been on different medication states it was not helpful.  Review of Systems  Constitutional: Negative for chills and diaphoresis.  HENT: Negative for ear discharge, ear pain and nosebleeds.   Eyes: Negative for discharge and visual disturbance.  Respiratory: Negative for  cough, choking and shortness of breath.   Cardiovascular: Negative for chest pain and palpitations.  Gastrointestinal: Negative for abdominal distention and abdominal pain.       Positive foraminal hernia which has repaired a coming up within the next week. Inguinal hernias on the left.  Endocrine: Negative for cold intolerance and heat intolerance.  Genitourinary: Negative for flank pain and hematuria.  Musculoskeletal: Positive for back pain.       On-the-job injury July 2017. Patient has not been able to make to physical therapy since he works until 5.  Skin: Negative for rash and wound.  Neurological: Negative for seizures and speech difficulty.  Hematological: Negative for adenopathy. Does not bruise/bleed easily.  Psychiatric/Behavioral: Negative for agitation and suicidal ideas.     Objective: Vital Signs: BP 122/81   Pulse 85   Ht 6\' 4"  (1.93 m)   Wt 166 lb (75.3 kg)   BMI 20.21 kg/m   Physical Exam  Constitutional: He is oriented to person, place, and time. He appears well-developed and well-nourished.  HENT:  Head: Normocephalic and atraumatic.  Eyes: EOM are normal. Pupils are equal, round, and reactive to light.  Neck: No tracheal deviation present. No thyromegaly present.  Cardiovascular: Normal rate.   Pulmonary/Chest: Effort normal. He has no wheezes.  Abdominal: Soft. Bowel sounds are normal.  Musculoskeletal:  Patient has pain  with straight leg raising on the right at 60. negative on the left. Some pain with positive compression. Reflexes are 1+ knee and ankle jerk. He is able to heel and toe walk. He has some sciatic notch tenderness. No rashes over exposed skin. Anterior tib EHL quads hip flexors are strong no hamstring weakness. No trochanteric bursal tenderness. No tenderness of trochanteric bursa. Slight tenderness over the peroneal nerve at the neck of the fibula. Normal hip range of motion. Knees reach full extension. No atrophy no synovitis no rash over  exposed skin.  Neurological: He is alert and oriented to person, place, and time.  Skin: Skin is warm and dry. Capillary refill takes less than 2 seconds.  Psychiatric: He has a normal mood and affect. His behavior is normal. Judgment and thought content normal.    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  Specialty Comments:  No specialty comments available.  Imaging: No results found.   PMFS History: Patient Active Problem List   Diagnosis Date Noted  . Back pain with right-sided sciatica 08/06/2016  . Headache 08/06/2016  . Tobacco use 08/06/2016  . Left inguinal hernia 08/06/2016  . Hearing loss 08/06/2016  . Poor dentition 08/06/2016   No past medical history on file.  Family History  Problem Relation Age of Onset  . Alcohol abuse Mother   . Stroke Mother        in her late 58's  . Heart attack Father        in his 25's  . Hypertension Father     Past Surgical History:  Procedure Laterality Date  . HERNIA REPAIR     2009 and 2012   Social History   Occupational History  . Not on file.   Social History Main Topics  . Smoking status: Current Every Day Smoker    Packs/day: 0.50    Years: 42.00    Types: Cigarettes  . Smokeless tobacco: Never Used  . Alcohol use No  . Drug use: No  . Sexual activity: Yes

## 2016-12-17 NOTE — Addendum Note (Signed)
Addended byPrescott Parma: [name] on: 12/17/2016 09:57 AM   Modules accepted: Orders

## 2016-12-28 ENCOUNTER — Ambulatory Visit
Admission: RE | Admit: 2016-12-28 | Discharge: 2016-12-28 | Disposition: A | Discharge status: Home / Self Care | Payer: BLUE CROSS/BLUE SHIELD | Source: Ambulatory Visit | Attending: Orthopaedic Surgery | Admitting: Orthopaedic Surgery

## 2016-12-28 DIAGNOSIS — M5441 Lumbago with sciatica, right side: Principal | ICD-10-CM

## 2016-12-28 DIAGNOSIS — G8929 Other chronic pain: Secondary | ICD-10-CM

## 2017-01-14 ENCOUNTER — Encounter (INDEPENDENT_AMBULATORY_CARE_PROVIDER_SITE_OTHER): Payer: Self-pay | Admitting: Orthopaedic Surgery

## 2017-01-14 ENCOUNTER — Ambulatory Visit (INDEPENDENT_AMBULATORY_CARE_PROVIDER_SITE_OTHER): Payer: BLUE CROSS/BLUE SHIELD | Admitting: Orthopaedic Surgery

## 2017-01-14 VITALS — BP 119/80 | HR 86 | Ht 76.0 in | Wt 160.0 lb

## 2017-01-14 DIAGNOSIS — M5431 Sciatica, right side: Secondary | ICD-10-CM | POA: Diagnosis not present

## 2017-01-14 NOTE — Progress Notes (Signed)
Office Visit Note   Patient: Steven Briggs           Date of Birth: 09/01/1965           MRN: [id number] Visit Date: 01/14/2017              Requested by: Julaine Fusianford, Katy D, NP [address] [place], [place] [id number] PCP: Julaine Fusianford, Katy D, NP   Assessment & Plan: Visit Diagnoses:  1. Back pain with right-sided sciatica     Plan: I discussed patient denies look for some work which is a little easier. He has some dehydration and desiccation of L4-5 and L5-S1 with very small right paracentral disc protrusion. He's artery looking for something less strenuous. 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 If he gets some significant increase we can consider a epidural injection. I think if he switches activities and he's doing less lifting he should have less problems. Pathophysiology discussed again copy of the report return when necessary.  Follow-Up Instructions: No Follow-up on file.   Orders:  No orders of the defined types were placed in this encounter.  No orders of the defined types were placed in this encounter.     Procedures: No procedures performed   Clinical Data: No additional findings.   Subjective: Chief Complaint  Patient presents with  . Lower Back - Pain, Follow-up    HPI 51 year old male returns states she still having back pain he states his primary care physician sending to get his pain medication from us. Of note he has a recent refill on 01/08/2017 of 30 tablets of oxycodone 5 which is coming from Dr. Abbey Chattersosenbower who did his hernia surgery. Prior that he got 40 tablets on 12/23/2016. 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 He has to [employer] refrigerators couches sleeper sofa was etc. Turning and twisting makes his back hurt. He denies bowel or bladder symptoms no fever chills. MRI scan has been obtained and is ready for review.  Review of Systems 14 point review of  systems is updated and is unchanged from 12/17/2016.   Objective: Vital Signs: BP 119/80   Pulse 86   Ht 6\' 4"  (1.93 m)   Wt 160 lb (72.6 kg)   BMI 19.48 kg/m   Physical Exam  Constitutional: He is oriented to person, place, and time. He appears well-developed and well-nourished.  HENT:  Head: Normocephalic and atraumatic.  Eyes: Pupils are equal, round, and reactive to light. EOM are normal.  Neck: No tracheal deviation present. No thyromegaly present.  Cardiovascular: Normal rate.   Pulmonary/Chest: Effort normal. He has no wheezes.  Abdominal: Soft. Bowel sounds are normal.  Musculoskeletal:  Normal heel toe gait he complains of discomfort with turning twisting bending. Good strength no isolated motor weakness sensory testing is normal hip and knee range of motion is normal. There is partial amputation finger which is well-healed. Good cervical range of motion.  Neurological: He is alert and oriented to person, place, and time.  Skin: Skin is warm and dry. Capillary refill takes less than 2 seconds.  Psychiatric: He has a normal mood and affect. His behavior is normal. Judgment and thought content normal.    Ortho Exam  Specialty Comments:  No specialty comments available.  Imaging: Study Result   CLINICAL DATA:  Right side low back pain radiating into the lateral aspect of the right thigh for 1 year. No  known injury.  EXAM: MRI LUMBAR SPINE WITHOUT CONTRAST  TECHNIQUE: Multiplanar, multisequence MR imaging of the lumbar spine was performed. No intravenous contrast was administered.  COMPARISON:  Two views lumbar spine performed at [facility] 12/17/2016.  FINDINGS: Segmentation:  Standard.  Alignment:  Maintained.  Vertebrae:  Height and signal are normal.  Conus medullaris: Extends to the L1 level and appears normal.  Paraspinal and other soft tissues: Negative.  Disc levels:  T11-12: The disc is desiccated and there is a minimal  bulge without central canal or foraminal stenosis.  T12-L1:  Negative.  L1-2:  Negative.  L2-3:  Negative.  L3-4:  Negative.  L4-5: The disc is desiccated with a small annular fissure in the right foramen. Minimal bulge is seen but the central canal and foramina are open.  L5-S1: Shallow right paracentral protrusion without central canal or foraminal stenosis.  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   Electronically Signed   By: Drusilla Kannerhomas  Dalessio M.D.   On: 12/28/2016 10:01       PMFS History: Patient Active Problem List   Diagnosis Date Noted  . Back pain with right-sided sciatica 08/06/2016  . Headache 08/06/2016  . Tobacco use 08/06/2016  . Left inguinal hernia 08/06/2016  . Hearing loss 08/06/2016  . Poor dentition 08/06/2016   No past medical history on file.  Family History  Problem Relation Age of Onset  . Alcohol abuse Mother   . Stroke Mother        in her late 4580's  . Heart attack Father        in his 7550's  . Hypertension Father     Past Surgical History:  Procedure Laterality Date  . HERNIA REPAIR     2009 and 2012   Social History   Occupational History  . Not on file.   Social History Main Topics  . Smoking status: Current Every Day Smoker    Packs/day: 0.50    Years: 42.00    Types: Cigarettes  . Smokeless tobacco: Never Used  . Alcohol use No  . Drug use: No  . Sexual activity: Yes

## 2017-02-05 ENCOUNTER — Ambulatory Visit (INDEPENDENT_AMBULATORY_CARE_PROVIDER_SITE_OTHER): Payer: BLUE CROSS/BLUE SHIELD | Admitting: Adult Health

## 2017-02-05 ENCOUNTER — Encounter: Payer: Self-pay | Admitting: Adult Health

## 2017-02-05 DIAGNOSIS — Z0000 Encounter for general adult medical examination without abnormal findings: Secondary | ICD-10-CM | POA: Diagnosis not present

## 2017-02-05 DIAGNOSIS — M5431 Sciatica, right side: Secondary | ICD-10-CM | POA: Diagnosis not present

## 2017-02-05 DIAGNOSIS — Z720 Tobacco use: Secondary | ICD-10-CM | POA: Diagnosis not present

## 2017-02-05 MED ORDER — CYCLOBENZAPRINE HCL 10 MG PO TABS: 10.0000 mg | ORAL_TABLET | Freq: Every day | ORAL | 0 refills | Status: DC

## 2017-02-05 NOTE — Assessment & Plan Note (Signed)
Down to pack every 1/2 week-GREAT!

## 2017-02-05 NOTE — Patient Instructions (Signed)
Back Exercises If you have pain in your back, do these exercises 2-3 times each day or as told by your doctor. When the pain goes away, do the exercises once each day, but repeat the steps more times for each exercise (do more repetitions). If you do not have pain in your back, do these exercises once each day or as told by your doctor. Exercises Single Knee to Chest  Do these steps 3-5 times in a row for each leg: 1. Lie on your back on a firm bed or the floor with your legs stretched out. 2. Bring one knee to your chest. 3. Hold your knee to your chest by grabbing your knee or thigh. 4. Pull on your knee until you feel a gentle stretch in your lower back. 5. Keep doing the stretch for 10-30 seconds. 6. Slowly let go of your leg and straighten it.  Pelvic Tilt  Do these steps 5-10 times in a row: 1. Lie on your back on a firm bed or the floor with your legs stretched out. 2. Bend your knees so they point up to the ceiling. Your feet should be flat on the floor. 3. Tighten your lower belly (abdomen) muscles to press your lower back against the floor. This will make your tailbone point up to the ceiling instead of pointing down to your feet or the floor. 4. Stay in this position for 5-10 seconds while you gently tighten your muscles and breathe evenly.  Cat-Cow  Do these steps until your lower back bends more easily: 1. Get on your hands and knees on a firm surface. Keep your hands under your shoulders, and keep your knees under your hips. You may put padding under your knees. 2. Let your head hang down, and make your tailbone point down to the floor so your lower back is round like the back of a cat. 3. Stay in this position for 5 seconds. 4. Slowly lift your head and make your tailbone point up to the ceiling so your back hangs low (sags) like the back of a cow. 5. Stay in this position for 5 seconds.  Press-Ups  Do these steps 5-10 times in a row: 1. Lie on your belly (face-down)  on the floor. 2. Place your hands near your head, about shoulder-width apart. 3. While you keep your back relaxed and keep your hips on the floor, slowly straighten your arms to raise the top half of your body and lift your shoulders. Do not use your back muscles. To make yourself more comfortable, you may change where you place your hands. 4. Stay in this position for 5 seconds. 5. Slowly return to lying flat on the floor.  Bridges  Do these steps 10 times in a row: 1. Lie on your back on a firm surface. 2. Bend your knees so they point up to the ceiling. Your feet should be flat on the floor. 3. Tighten your butt muscles and lift your butt off of the floor until your waist is almost as high as your knees. If you do not feel the muscles working in your butt and the back of your thighs, slide your feet 1-2 inches farther away from your butt. 4. Stay in this position for 3-5 seconds. 5. Slowly lower your butt to the floor, and let your butt muscles relax.  If this exercise is too easy, try doing it with your arms crossed over your chest. Belly Crunches  Do these steps 5-10 times in   a row: 1. Lie on your back on a firm bed or the floor with your legs stretched out. 2. Bend your knees so they point up to the ceiling. Your feet should be flat on the floor. 3. Cross your arms over your chest. 4. Tip your chin a little bit toward your chest but do not bend your neck. 5. Tighten your belly muscles and slowly raise your chest just enough to lift your shoulder blades a tiny bit off of the floor. 6. Slowly lower your chest and your head to the floor.  Back Lifts Do these steps 5-10 times in a row: 1. Lie on your belly (face-down) with your arms at your sides, and rest your forehead on the floor. 2. Tighten the muscles in your legs and your butt. 3. Slowly lift your chest off of the floor while you keep your hips on the floor. Keep the back of your head in line with the curve in your back. Look at  the floor while you do this. 4. Stay in this position for 3-5 seconds. 5. Slowly lower your chest and your face to the floor.  Contact a doctor if:  Your back pain gets a lot worse when you do an exercise.  Your back pain does not lessen 2 hours after you exercise. If you have any of these problems, stop doing the exercises. Do not do them again unless your doctor says it is okay. Get help right away if:  You have sudden, very bad back pain. If this happens, stop doing the exercises. Do not do them again unless your doctor says it is okay. This information is not intended to replace advice given to you by your health care provider. Make sure you discuss any questions you have with your health care provider. Document Released: 07/06/2010 Document Revised: 11/09/2015 Document Reviewed: 07/28/2014 Elsevier Interactive Patient Education  2018 Elsevier Inc.   Back Pain, Adult Back pain is very common in adults.The cause of back pain is rarely dangerous and the pain often gets better over time.The cause of your back pain may not be known. Some common causes of back pain include:  Strain of the muscles or ligaments supporting the spine.  Wear and tear (degeneration) of the spinal disks.  Arthritis.  Direct injury to the back.  For many people, back pain may return. Since back pain is rarely dangerous, most people can learn to manage this condition on their own. Follow these instructions at home: Watch your back pain for any changes. The following actions may help to lessen any discomfort you are feeling:  Remain active. It is stressful on your back to sit or stand in one place for long periods of time. Do not sit, drive, or stand in one place for more than 30 minutes at a time. Take short walks on even surfaces as soon as you are able.Try to increase the length of time you walk each day.  Exercise regularly as directed by your health care provider. Exercise helps your back heal  faster. It also helps avoid future injury by keeping your muscles strong and flexible.  Do not stay in bed.Resting more than 1-2 days can delay your recovery.  Pay attention to your body when you bend and lift. The most comfortable positions are those that put less stress on your recovering back. Always use proper lifting techniques, including: ? Bending your knees. ? Keeping the load close to your body. ? Avoiding twisting.  Find a comfortable position  to sleep. Use a firm mattress and lie on your side with your knees slightly bent. If you lie on your back, put a pillow under your knees.  Avoid feeling anxious or stressed.Stress increases muscle tension and can worsen back pain.It is important to recognize when you are anxious or stressed and learn ways to manage it, such as with exercise.  Take medicines only as directed by your health care provider. Over-the-counter medicines to reduce pain and inflammation are often the most helpful.Your health care provider may prescribe muscle relaxant drugs.These medicines help dull your pain so you can more quickly return to your normal activities and healthy exercise.  Apply ice to the injured area: ? Put ice in a plastic bag. ? Place a towel between your skin and the bag. ? Leave the ice on for 20 minutes, 2-3 times a day for the first 2-3 days. After that, ice and heat may be alternated to reduce pain and spasms.  Maintain a healthy weight. Excess weight puts extra stress on your back and makes it difficult to maintain good posture.  Contact a health care provider if:  You have pain that is not relieved with rest or medicine.  You have increasing pain going down into the legs or buttocks.  You have pain that does not improve in one week.  You have night pain.  You lose weight.  You have a fever or chills. Get help right away if:  You develop new bowel or bladder control problems.  You have unusual weakness or numbness in your  arms or legs.  You develop nausea or vomiting.  You develop abdominal pain.  You feel faint. This information is not intended to replace advice given to you by your health care provider. Make sure you discuss any questions you have with your health care provider. Document Released: 06/03/2005 Document Revised: 10/12/2015 Document Reviewed: 10/05/2013 Elsevier Interactive Patient Education  2017 Elsevier Inc.  Continue stretching/heating pad/Epson salt baths/OTC Advil Final cyclobenzaprine rx provided-if you require additional oral pain/muscle relaxer medication you will be referred to pain management. Please call your orthopedic specialist and schedule appt for injection therapy. Please schedule complete physical this fall. 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 FEEL BETTER!

## 2017-02-05 NOTE — Assessment & Plan Note (Signed)
One final Rx for Cyclobenzaprine 10mg  QHS provided. Will NOT be rx'g any more narcotics/muscle relaxers.  Controlled Substance Database verified- he received  Hydrocodone count 15 tabs on 10/29/16 Oxy count 40 tabs on 01/02/17 Oxy count 30 tabs on 01/08/17 If he requires any more oral pain control-referral to pain clinic. Advised to f/u with Ortho to proceed with injection therapy. Encouraged to find different employment, where strenuous manual labor is not required. Continue OTC Ibuprofen as directed my manufacturer. Continue daily stretching/heating pad/Epson Salt baths.

## 2017-02-05 NOTE — Assessment & Plan Note (Signed)
CPE this fall

## 2017-02-05 NOTE — Progress Notes (Signed)
Subjective:    Patient ID: Steven Briggs, male    DOB: 1965-08-21, 51 y.o.   MRN: [id number]  HPI:  Mr. Deal is here for continues back pain.  He has seen [facility] twice in July.   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 Ortho recommended finding employment that does not require strenuous manual labor-I agree! Ortho also offered injection therapy to help alleviate pain-I agree! He continues to treat pain with OTC Ibuprofen, stretching, heating pad, Epson Salt baths.  He has been off of work due to L Hernia surgical repair on 12/23/16, he is cleared to return to work without restriction per his surgeon 02/23/17.   He feels that his pain is constant ache/throb that will radiate to RLE especially when sitting for prolonged periods.  He was asked several times to rate pain and could not provide a number.  He denies bowel/bladder dysfunction.  Of Note:  [place] Controlled Substance Database verified- he received  Hydrocodone count 15 tabs on 10/29/16 Oxy count 40 tabs on 01/02/17 Oxy count 30 tabs on 01/08/17 We discussed that we will not be providing narcotic rx's. If he requires any more oral pain control-referral to pain clinic.  Review of Systems  Constitutional: Positive for activity change and fatigue. Negative for appetite change, chills, diaphoresis, fever and unexpected weight change.  Respiratory: Negative for cough, chest tightness, shortness of breath, wheezing and stridor.   Cardiovascular: Negative for chest pain, palpitations and leg swelling.  Gastrointestinal: Negative for abdominal distention, abdominal pain, blood in stool, constipation, diarrhea, nausea and vomiting.  Endocrine: Negative for cold intolerance, heat intolerance, polydipsia, polyphagia and polyuria.  Genitourinary: Negative for difficulty urinating and flank pain.   Musculoskeletal: Positive for arthralgias, back pain, gait problem, myalgias and neck stiffness. Negative for joint swelling and neck pain.  Skin: Negative for color change, pallor and wound.  Neurological: Negative for dizziness, tremors and weakness.  Hematological: Does not bruise/bleed easily.  Psychiatric/Behavioral: Positive for sleep disturbance. Negative for decreased concentration, dysphoric mood, hallucinations, self-injury and suicidal ideas. The patient is not nervous/anxious and is not hyperactive.        Objective:   Physical Exam  Constitutional: He is oriented to person, place, and time. He appears well-developed and well-nourished. No distress.  HENT:  Head: Normocephalic and atraumatic.  Right Ear: External ear normal.  Left Ear: External ear normal.  Eyes: Pupils are equal, round, and reactive to light. Conjunctivae are normal.  Cardiovascular: Normal rate, regular rhythm, normal heart sounds and intact distal pulses.   No murmur heard. Pulmonary/Chest: Breath sounds normal. No respiratory distress. He has no wheezes. He has no rales. He exhibits no tenderness.  Abdominal: Soft. Bowel sounds are normal. He exhibits no distension and no mass. There is no tenderness. There is no rebound and no guarding.  Musculoskeletal: Normal range of motion. He exhibits tenderness and deformity. He exhibits no edema.       Right hip: He exhibits normal range of motion, normal strength and no tenderness.       Left hip: He exhibits normal range of motion, normal strength and no tenderness.       Lumbar back: He exhibits tenderness, deformity, pain and spasm. He exhibits normal range of motion and no bony tenderness.  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 Lumbar back-normal ROM, however causes him moderate discomfort. Positive R leg raise.  Neurological: He is  alert and oriented to person, place, and time. Coordination normal.  Skin: Skin is warm and dry. No rash noted. He is not  diaphoretic. No erythema. No pallor.  Psychiatric: He has a normal mood and affect. His behavior is normal. Judgment and thought content normal.  Nursing note and vitals reviewed.         Assessment & Plan:   1. Back pain with right-sided sciatica   2. Tobacco use   3. Healthcare maintenance     Back pain with right-sided sciatica One final Rx for Cyclobenzaprine 10mg  QHS provided. Will NOT be rx'g any more narcotics/muscle relaxers. [place] Controlled Substance Database verified- he received  Hydrocodone count 15 tabs on 10/29/16 Oxy count 40 tabs on 01/02/17 Oxy count 30 tabs on 01/08/17 If he requires any more oral pain control-referral to pain clinic. Advised to f/u with Ortho to proceed with injection therapy. Encouraged to find different employment, where strenuous manual labor is not required. Continue OTC Ibuprofen as directed my manufacturer. Continue daily stretching/heating pad/Epson Salt baths.  Tobacco use Down to pack every 1/2 week-GREAT!  Healthcare maintenance CPE this fall    FOLLOW-UP:  Return in about 3 months (around 05/08/2017) for CPE.

## 2017-05-21 ENCOUNTER — Ambulatory Visit (INDEPENDENT_AMBULATORY_CARE_PROVIDER_SITE_OTHER): Payer: Self-pay | Admitting: Adult Health

## 2017-05-21 ENCOUNTER — Encounter: Payer: Self-pay | Admitting: Adult Health

## 2017-05-21 ENCOUNTER — Ambulatory Visit: Payer: Self-pay

## 2017-05-21 VITALS — BP 112/62 | HR 60 | Temp 98.5°F | Ht 76.0 in | Wt 162.5 lb

## 2017-05-21 DIAGNOSIS — M79672 Pain in left foot: Secondary | ICD-10-CM

## 2017-05-21 DIAGNOSIS — G5762 Lesion of plantar nerve, left lower limb: Secondary | ICD-10-CM | POA: Insufficient documentation

## 2017-05-21 DIAGNOSIS — H9201 Otalgia, right ear: Secondary | ICD-10-CM

## 2017-05-21 MED ORDER — CIPROFLOXACIN-DEXAMETHASONE 0.3-0.1 % OT SUSP: 4.0000 [drp] | Freq: Two times a day (BID) | OTIC | 0 refills | Status: DC

## 2017-05-21 MED ORDER — MELOXICAM 7.5 MG PO TABS: 7.5000 mg | ORAL_TABLET | Freq: Two times a day (BID) | ORAL | 0 refills | Status: DC | PRN

## 2017-05-21 NOTE — Assessment & Plan Note (Signed)
Ear flushed, cerumen removed. Ciprodex ear gtt's provided

## 2017-05-21 NOTE — Progress Notes (Signed)
Subjective:    Patient ID: Steven Briggs, male    DOB: 12/12/1965, 51 y.o.   MRN: [id number]  HPI:  [name] presents with two acute compliants: 1) R ear pain when he tries to insert his ear plugs.  Pain 10/10, described as pressure and started 2 days ago. He recently had hearing test and hearing loss was detected, R worse than L He reports thick/clear nasal drainage for the last several weeks. 2) L foot pain and swelling that started > 1 week ago.  He denies acute injury/accident prior to onset of pain.  Pain is shooting and worsened with prolonged standing, walking.  He has purchased inserts, and been soaking in Epson salt bath nightly with some minor relief.   He denies either of these issues happening previously.  Patient Care Team    Relationship Specialty Notifications Start End  Julaine Fusianford, Geraldy Akridge D, NP PCP - General Family Medicine  08/06/16     Patient Active Problem List   Diagnosis Date Noted  . Left foot pain 05/21/2017  . Ear pain, right 05/21/2017  . 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 Healthcare maintenance 02/05/2017  . Back pain with right-sided sciatica 08/06/2016  . Headache 08/06/2016  . Tobacco use 08/06/2016  . Left inguinal hernia 08/06/2016  . Hearing loss 08/06/2016  . Poor dentition 08/06/2016     History reviewed. No pertinent past medical history.   Past Surgical History:  Procedure Laterality Date  . HERNIA REPAIR     2009 and 2012     Family History  Problem Relation Age of Onset  . Alcohol abuse Mother   . Stroke Mother        in her late 7780's  . Heart attack Father        in his 6750's  . Hypertension Father      Social History   Substance and Sexual Activity  Drug Use No     Social History   Substance and Sexual Activity  Alcohol Use No     Social History   Tobacco Use  Smoking Status Current Every Day Smoker  . Packs/day: 0.50  . Years: 42.00  . Pack years: 21.00  . Types: Cigarettes  Smokeless Tobacco Never  Used     Outpatient Encounter Medications as of 05/21/2017  Medication Sig  . Acetaminophen-Caffeine (EXCEDRIN TENSION HEADACHE) 500-65 MG TABS Take by mouth.  . ciprofloxacin-dexamethasone (CIPRODEX) OTIC suspension Place 4 drops into the right ear 2 (two) times daily.  . meloxicam (MOBIC) 7.5 MG tablet Take 1 tablet (7.5 mg total) by mouth 2 (two) times daily as needed for pain.  . [DISCONTINUED] cyclobenzaprine (FLEXERIL) 10 MG tablet Take 1 tablet (10 mg total) by mouth at bedtime.   No facility-administered encounter medications on file as of 05/21/2017.     Allergies: Patient has no known allergies.  Body mass index is 19.78 kg/m.  Blood pressure 112/62, pulse 60, temperature 98.5 F (36.9 C), temperature source Oral, height 6\' 4"  (1.93 m), weight 162 lb 8 oz (73.7 kg).     Review of Systems  Constitutional: Positive for activity change and fatigue. Negative for appetite change, chills, diaphoresis, fever and unexpected weight change.  HENT: Negative for congestion, postnasal drip and sinus pressure.   Eyes: Negative for visual disturbance.  Respiratory: Negative for cough, chest tightness, shortness of breath, wheezing and stridor.   Cardiovascular: Negative for chest pain, palpitations and leg swelling.  Musculoskeletal: Negative for arthralgias, back pain, gait problem, joint  swelling and myalgias.  Hematological: Does not bruise/bleed easily.       Objective:   Physical Exam  Constitutional: He is oriented to person, place, and time. He appears well-developed and well-nourished. No distress.  HENT:  Head: Normocephalic and atraumatic.  Right Ear: External ear normal. Tympanic membrane is not erythematous. Decreased hearing is noted.  Left Ear: External ear normal. Tympanic membrane is not erythematous. Decreased hearing is noted.  Nose: Mucosal edema and rhinorrhea present. Right sinus exhibits no maxillary sinus tenderness and no frontal sinus tenderness. Left  sinus exhibits no maxillary sinus tenderness and no frontal sinus tenderness.  Mouth/Throat: Uvula is midline, oropharynx is clear and moist and mucous membranes are normal. Abnormal dentition. Dental caries present. No oropharyngeal exudate or tonsillar abscesses.  Eyes: Conjunctivae are normal. Pupils are equal, round, and reactive to light.  Neck: Normal range of motion. Neck supple.  Cardiovascular: Normal rate, regular rhythm, normal heart sounds and intact distal pulses.  No murmur heard. Pulmonary/Chest: Effort normal and breath sounds normal. No respiratory distress. He has no wheezes. He has no rales. He exhibits no tenderness.  Musculoskeletal: He exhibits edema and tenderness.       Left foot: There is tenderness, bony tenderness and swelling.       Feet:  1 discrete, circular mass noted at base of 1st metatarsal  Lymphadenopathy:    He has no cervical adenopathy.  Neurological: He is alert and oriented to person, place, and time.  Skin: Skin is warm. No rash noted. He is not diaphoretic. No erythema. No pallor.  Psychiatric: He has a normal mood and affect. His behavior is normal. Judgment and thought content normal.          Assessment & Plan:   1. Left foot pain   2. Ear pain, right   3. Morton's neuralgia, left     Ear pain, right Ear flushed, cerumen removed. Ciprodex ear gtt's provided  Morton's neuralgia, left L foot Xray: IMPRESSION: No acute osseous injury of the left foot. Rest, ice, elevate left foot. Please use Meloxicam as directed and take with food. Referral to podiatry placed. Please call clinic with any questions/concerns.  Left foot pain L foot Xray: IMPRESSION: No acute osseous injury of the left foot.    FOLLOW-UP:  Return if symptoms worsen or fail to improve.

## 2017-05-21 NOTE — Patient Instructions (Signed)
Earache, Adult An earache, or ear pain, can be caused by many things, including:  An infection.  Ear wax buildup.  Ear pressure.  Something in the ear that should not be there (foreign body).  A sore throat.  Tooth problems.  Jaw problems.  Treatment of the earache will depend on the cause. If the cause is not clear or cannot be determined, you may need to watch your symptoms until your earache goes away or until a cause is found. Follow these instructions at home: Pay attention to any changes in your symptoms. Take these actions to help with your pain:  Take or apply over-the-counter and prescription medicines only as told by your health care provider.  If you were prescribed an antibiotic medicine, use it as told by your health care provider. Do not stop using the antibiotic even if you start to feel better.  Do not put anything in your ear other than medicine that is prescribed by your health care provider.  If directed, apply heat to the affected area as often as told by your health care provider. Use the heat source that your health care provider recommends, such as a moist heat pack or a heating pad. ? Place a towel between your skin and the heat source. ? Leave the heat on for 20-30 minutes. ? Remove the heat if your skin turns bright red. This is especially important if you are unable to feel pain, heat, or cold. You may have a greater risk of getting burned.  If directed, put ice on the ear: ? Put ice in a plastic bag. ? Place a towel between your skin and the bag. ? Leave the ice on for 20 minutes, 2-3 times a day.  Try resting in an upright position instead of lying down. This may help to reduce pressure in your ear and relieve pain.  Chew gum if it helps to relieve your ear pain.  Treat any allergies as told by your health care provider.  Keep all follow-up visits as told by your health care provider. This is important.  Contact a health care provider  if:  Your pain does not improve within 2 days.  Your earache gets worse.  You have new symptoms.  You have a fever. Get help right away if:  You have a severe headache.  You have a stiff neck.  You have trouble swallowing.  You have redness or swelling behind your ear.  You have fluid or blood coming from your ear.  You have hearing loss.  You feel dizzy. This information is not intended to replace advice given to you by your health care provider. Make sure you discuss any questions you have with your health care provider. Document Released: 01/19/2004 Document Revised: 01/30/2016 Document Reviewed: 11/27/2015 Elsevier Interactive Patient Education  2018 [employer].  Gwenevere AbbotMorton Neuralgia Gwenevere AbbotMorton neuralgia is a type of foot pain in the area closest to your toes. This area is sometimes called the ball of your foot. Morton neuralgia occurs when a branch of a nerve in your foot (digital nerve) becomes compressed. When this happens over a long period of time, the nerve can thicken (neuroma) and cause pain. This usually occurs between the third and fourth toe. Morton neuralgia can come and go but may get worse over time. What are the causes? Your digital nerve can become compressed and stretched at a point where it passes under a thick band of tissue that connects your toes (intermetatarsal ligament). Morton neuralgia can  be caused by mild repetitive damage in this area. This type of damage can result from:  Activities such as running or jumping.  Wearing shoes that are too tight.  What increases the risk? You may be at risk for Morton neuralgia if you:  Are male.  Wear high heels.  Wear shoes that are narrow or tight.  Participate in activities that stretch your toes. These include: ? Running. ? Ballet. ? Long-distance walking.  What are the signs or symptoms? The first symptom of Morton neuralgia is pain that spreads from the ball of your foot to your toes. It may  feel like you are walking on a marble. Pain usually gets worse with walking and goes away at night. Other symptoms may include numbness and cramping of your toes. How is this diagnosed? Your health care provider will do a physical exam. When doing the exam, your health care provider may:  Squeeze your foot just behind your toe.  Ask you to move your toes to check for pain.  You may also have tests on your foot to confirm the diagnosis. These may include:  An X-ray.  An MRI.  How is this treated? Treatment for Morton neuralgia may be as simple as changing the kind of shoes you wear. Other treatments may include:  Wearing a supportive pad (orthosis) under the front of your foot. This lifts your toe bones and takes pressure off the nerve.  Getting injections of numbing medicine and anti-inflammatory medicine (steroid) in the nerve.  Having surgery to remove part of the thickened nerve.  Follow these instructions at home:  Take medicine only as directed by your health care provider.  Wear soft-soled shoes with a wide toe area.  Stop activities that may be causing pain.  Elevate your foot when resting.  Massage your foot.  Apply ice to the injured area: ? Put ice in a plastic bag. ? Place a towel between your skin and the bag. ? Leave the ice on for 20 minutes, 2-3 times a day.  Keep all follow-up visits as directed by your health care provider. This is important. Contact a health care provider if:  Home care instructions are not helping you get better.  Your symptoms change or get worse. This information is not intended to replace advice given to you by your health care provider. Make sure you discuss any questions you have with your health care provider. Document Released: 09/09/2000 Document Revised: 11/09/2015 Document Reviewed: 08/04/2013 Elsevier Interactive Patient Education  2018 [employer]  Rest, ice, elevate left foot. Please use Meloxicam as directed and  take with food. Referral to podiatry placed. Please use ear drops as directed. Please call clinic with any questions/concerns. FEEL BETTER!

## 2017-05-21 NOTE — Assessment & Plan Note (Addendum)
L foot Xray: IMPRESSION: No acute osseous injury of the left foot. Rest, ice, elevate left foot. Please use Meloxicam as directed and take with food. Referral to podiatry placed. Please call clinic with any questions/concerns.

## 2017-05-26 ENCOUNTER — Other Ambulatory Visit: Payer: Self-pay | Admitting: Adult Health

## 2017-05-26 DIAGNOSIS — M5431 Sciatica, right side: Secondary | ICD-10-CM

## 2017-05-26 NOTE — Assessment & Plan Note (Signed)
L foot Xray: IMPRESSION: No acute osseous injury of the left foot.

## 2018-03-19 ENCOUNTER — Encounter: Payer: Self-pay | Admitting: Adult Health

## 2018-03-19 ENCOUNTER — Ambulatory Visit: Payer: BLUE CROSS/BLUE SHIELD | Admitting: Adult Health

## 2018-03-19 VITALS — BP 101/62 | HR 72 | Temp 98.5°F | Ht 76.0 in | Wt 163.3 lb

## 2018-03-19 DIAGNOSIS — J40 Bronchitis, not specified as acute or chronic: Secondary | ICD-10-CM

## 2018-03-19 DIAGNOSIS — Z0000 Encounter for general adult medical examination without abnormal findings: Secondary | ICD-10-CM

## 2018-03-19 DIAGNOSIS — Z1211 Encounter for screening for malignant neoplasm of colon: Secondary | ICD-10-CM

## 2018-03-19 DIAGNOSIS — N529 Male erectile dysfunction, unspecified: Secondary | ICD-10-CM

## 2018-03-19 MED ORDER — DOXYCYCLINE HYCLATE 100 MG PO TABS: 100.0000 mg | ORAL_TABLET | Freq: Two times a day (BID) | ORAL | 0 refills | Status: DC

## 2018-03-19 MED ORDER — FLUTICASONE PROPIONATE 50 MCG/ACT NA SUSP: 2.0000 | Freq: Every day | NASAL | 6 refills | Status: DC

## 2018-03-19 MED ORDER — HYDROCOD POLST-CPM POLST ER 10-8 MG/5ML PO SUER: 5.0000 mL | Freq: Two times a day (BID) | ORAL | 0 refills | Status: DC | PRN

## 2018-03-19 MED ORDER — LOPERAMIDE HCL 2 MG PO CAPS: 2.0000 mg | ORAL_CAPSULE | ORAL | 0 refills | Status: DC | PRN

## 2018-03-19 NOTE — Patient Instructions (Addendum)
Acute Bronchitis, Adult Acute bronchitis is sudden (acute) swelling of the air tubes (bronchi) in the lungs. Acute bronchitis causes these tubes to fill with mucus, which can make it hard to breathe. It can also cause coughing or wheezing. In adults, acute bronchitis usually goes away within 2 weeks. A cough caused by bronchitis may last up to 3 weeks. Smoking, allergies, and asthma can make the condition worse. Repeated episodes of bronchitis may cause further lung problems, such as chronic obstructive pulmonary disease (COPD). What are the causes? This condition can be caused by germs and by substances that irritate the lungs, including:  Cold and flu viruses. This condition is most often caused by the same virus that causes a cold.  Bacteria.  Exposure to tobacco smoke, dust, fumes, and air pollution.  What increases the risk? This condition is more likely to develop in people who:  Have close contact with someone with acute bronchitis.  Are exposed to lung irritants, such as tobacco smoke, dust, fumes, and vapors.  Have a weak immune system.  Have a respiratory condition such as asthma.  What are the signs or symptoms? Symptoms of this condition include:  A cough.  Coughing up clear, yellow, or green mucus.  Wheezing.  Chest congestion.  Shortness of breath.  A fever.  Body aches.  Chills.  A sore throat.  How is this diagnosed? This condition is usually diagnosed with a physical exam. During the exam, your health care provider may order tests, such as chest X-rays, to rule out other conditions. He or she may also:  Test a sample of your mucus for bacterial infection.  Check the level of oxygen in your blood. This is done to check for pneumonia.  Do a chest X-ray or lung function testing to rule out pneumonia and other conditions.  Perform blood tests.  Your health care provider will also ask about your symptoms and medical history. How is this  treated? Most cases of acute bronchitis clear up over time without treatment. Your health care provider may recommend:  Drinking more fluids. Drinking more makes your mucus thinner, which may make it easier to breathe.  Taking a medicine for a fever or cough.  Taking an antibiotic medicine.  Using an inhaler to help improve shortness of breath and to control a cough.  Using a cool mist vaporizer or humidifier to make it easier to breathe.  Follow these instructions at home: Medicines  Take over-the-counter and prescription medicines only as told by your health care provider.  If you were prescribed an antibiotic, take it as told by your health care provider. Do not stop taking the antibiotic even if you start to feel better. General instructions  Get plenty of rest.  Drink enough fluids to keep your urine clear or pale yellow.  Avoid smoking and secondhand smoke. Exposure to cigarette smoke or irritating chemicals will make bronchitis worse. If you smoke and you need help quitting, ask your health care provider. Quitting smoking will help your lungs heal faster.  Use an inhaler, cool mist vaporizer, or humidifier as told by your health care provider.  Keep all follow-up visits as told by your health care provider. This is important. How is this prevented? To lower your risk of getting this condition again:  Wash your hands often with soap and water. If soap and water are not available, use hand sanitizer.  Avoid contact with people who have cold symptoms.  Try not to touch your hands to your   mouth, nose, or eyes.  Make sure to get the flu shot every year.  Contact a health care provider if:  Your symptoms do not improve in 2 weeks of treatment. Get help right away if:  You cough up blood.  You have chest pain.  You have severe shortness of breath.  You become dehydrated.  You faint or keep feeling like you are going to faint.  You keep vomiting.  You have a  severe headache.  Your fever or chills gets worse. This information is not intended to replace advice given to you by your health care provider. Make sure you discuss any questions you have with your health care provider. Document Released: 07/11/2004 Document Revised: 12/27/2015 Document Reviewed: 11/22/2015 Elsevier Interactive Patient Education  2018 [employer].   Sinusitis, Adult Sinusitis is soreness and inflammation of your sinuses. Sinuses are hollow spaces in the bones around your face. Your sinuses are located:  Around your eyes.  In the middle of your forehead.  Behind your nose.  In your cheekbones.  Your sinuses and nasal passages are lined with a stringy fluid (mucus). Mucus normally drains out of your sinuses. When your nasal tissues become inflamed or swollen, the mucus can become trapped or blocked so air cannot flow through your sinuses. This allows bacteria, viruses, and funguses to grow, which leads to infection. Sinusitis can develop quickly and last for 7?10 days (acute) or for more than 12 weeks (chronic). Sinusitis often develops after a cold. What are the causes? This condition is caused by anything that creates swelling in the sinuses or stops mucus from draining, including:  Allergies.  Asthma.  Bacterial or viral infection.  Abnormally shaped bones between the nasal passages.  Nasal growths that contain mucus (nasal polyps).  Narrow sinus openings.  Pollutants, such as chemicals or irritants in the air.  A foreign object stuck in the nose.  A fungal infection. This is rare.  What increases the risk? The following factors may make you more likely to develop this condition:  Having allergies or asthma.  Having had a recent cold or respiratory tract infection.  Having structural deformities or blockages in your nose or sinuses.  Having a weak immune system.  Doing a lot of swimming or diving.  Overusing nasal  sprays.  Smoking.  What are the signs or symptoms? The main symptoms of this condition are pain and a feeling of pressure around the affected sinuses. Other symptoms include:  Upper toothache.  Earache.  Headache.  Bad breath.  Decreased sense of smell and taste.  A cough that may get worse at night.  Fatigue.  Fever.  Thick drainage from your nose. The drainage is often green and it may contain pus (purulent).  Stuffy nose or congestion.  Postnasal drip. This is when extra mucus collects in the throat or back of the nose.  Swelling and warmth over the affected sinuses.  Sore throat.  Sensitivity to light.  How is this diagnosed? This condition is diagnosed based on symptoms, a medical history, and a physical exam. To find out if your condition is acute or chronic, your health care provider may:  Look in your nose for signs of nasal polyps.  Tap over the affected sinus to check for signs of infection.  View the inside of your sinuses using an imaging device that has a light attached (endoscope).  If your health care provider suspects that you have chronic sinusitis, you may also:  Be tested for allergies.  Have a sample of mucus taken from your nose (nasal culture) and checked for bacteria.  Have a mucus sample examined to see if your sinusitis is related to an allergy.  If your sinusitis does not respond to treatment and it lasts longer than 8 weeks, you may have an MRI or CT scan to check your sinuses. These scans also help to determine how severe your infection is. In rare cases, a bone biopsy may be done to rule out more serious types of fungal sinus disease. How is this treated? Treatment for sinusitis depends on the cause and whether your condition is chronic or acute. If a virus is causing your sinusitis, your symptoms will go away on their own within 10 days. You may be given medicines to relieve your symptoms, including:  Topical nasal decongestants.  They shrink swollen nasal passages and let mucus drain from your sinuses.  Antihistamines. These drugs block inflammation that is triggered by allergies. This can help to ease swelling in your nose and sinuses.  Topical nasal corticosteroids. These are nasal sprays that ease inflammation and swelling in your nose and sinuses.  Nasal saline washes. These rinses can help to get rid of thick mucus in your nose.  If your condition is caused by bacteria, you will be given an antibiotic medicine. If your condition is caused by a fungus, you will be given an antifungal medicine. Surgery may be needed to correct underlying conditions, such as narrow nasal passages. Surgery may also be needed to remove polyps. Follow these instructions at home: Medicines  Take, use, or apply over-the-counter and prescription medicines only as told by your health care provider. These may include nasal sprays.  If you were prescribed an antibiotic medicine, take it as told by your health care provider. Do not stop taking the antibiotic even if you start to feel better. Hydrate and Humidify  Drink enough water to keep your urine clear or pale yellow. Staying hydrated will help to thin your mucus.  Use a cool mist humidifier to keep the humidity level in your home above 50%.  Inhale steam for 10-15 minutes, 3-4 times a day or as told by your health care provider. You can do this in the bathroom while a hot shower is running.  Limit your exposure to cool or dry air. Rest  Rest as much as possible.  Sleep with your head raised (elevated).  Make sure to get enough sleep each night. General instructions  Apply a warm, moist washcloth to your face 3-4 times a day or as told by your health care provider. This will help with discomfort.  Wash your hands often with soap and water to reduce your exposure to viruses and other germs. If soap and water are not available, use hand sanitizer.  Do not smoke. Avoid being  around people who are smoking (secondhand smoke).  Keep all follow-up visits as told by your health care provider. This is important. Contact a health care provider if:  You have a fever.  Your symptoms get worse.  Your symptoms do not improve within 10 days. Get help right away if:  You have a severe headache.  You have persistent vomiting.  You have pain or swelling around your face or eyes.  You have vision problems.  You develop confusion.  Your neck is stiff.  You have trouble breathing. This information is not intended to replace advice given to you by your health care provider. Make sure you discuss any questions you  have with your health care provider. Document Released: 06/03/2005 Document Revised: 01/28/2016 Document Reviewed: 03/29/2015 Elsevier Interactive Patient Education  2018 [employer].   Diarrhea, Adult Diarrhea is frequent loose and watery bowel movements. Diarrhea can make you feel weak and cause you to become dehydrated. Dehydration can make you tired and thirsty, cause you to have a dry mouth, and decrease how often you urinate. Diarrhea typically lasts 2-3 days. However, it can last longer if it is a sign of something more serious. It is important to treat your diarrhea as told by your health care provider. Follow these instructions at home: Eating and drinking  Follow these recommendations as told by your health care provider:  Take an oral rehydration solution (ORS). This is a drink that is sold at pharmacies and retail stores.  Drink clear fluids, such as water, ice chips, diluted fruit juice, and low-calorie sports drinks.  Eat bland, easy-to-digest foods in small amounts as you are able. These foods include bananas, applesauce, rice, lean meats, toast, and crackers.  Avoid drinking fluids that contain a lot of sugar or caffeine, such as energy drinks, sports drinks, and soda.  Avoid alcohol.  Avoid spicy or fatty foods.  General  instructions  Drink enough fluid to keep your urine clear or pale yellow.  Wash your hands often. If soap and water are not available, use hand sanitizer.  Make sure that all people in your household wash their hands well and often.  Take over-the-counter and prescription medicines only as told by your health care provider.  Rest at home while you recover.  Watch your condition for any changes.  Take a warm bath to relieve any burning or pain from frequent diarrhea episodes.  Keep all follow-up visits as told by your health care provider. This is important. Contact a health care provider if:  You have a fever.  Your diarrhea gets worse.  You have new symptoms.  You cannot keep fluids down.  You feel light-headed or dizzy.  You have a headache  You have muscle cramps. Get help right away if:  You have chest pain.  You feel extremely weak or you faint.  You have bloody or black stools or stools that look like tar.  You have severe pain, cramping, or bloating in your abdomen.  You have trouble breathing or you are breathing very quickly.  Your heart is beating very quickly.  Your skin feels cold and clammy.  You feel confused.  You have signs of dehydration, such as: ? Dark urine, very little urine, or no urine. ? Cracked lips. ? Dry mouth. ? Sunken eyes. ? Sleepiness. ? Weakness. This information is not intended to replace advice given to you by your health care provider. Make sure you discuss any questions you have with your health care provider. Document Released: 05/24/2002 Document Revised: 10/12/2015 Document Reviewed: 02/07/2015 Elsevier Interactive Patient Education  2018 [employer].  1) Please take Doxycycline as directed. 2) Please take Tussionex, Flonase, and Loperamide as needed. 3) Push fluids and follow BRAT diet. 4) Referral to Gastroenterologist placed, re: Colonoscopy. 5) Please call your surgeon and ask about possible complications  with hernia repair. 6) Please schedule complete physical and fasting labs in 6 weeks. 7) We can further discuss ED treatment at follow-up. CONGRATULATIONS ON NEW JOB. NICE TO SEE YOU!

## 2018-03-19 NOTE — Progress Notes (Signed)
Subjective:    Patient ID: Steven Briggs, male    DOB: 03-23-1966, 52 y.o.   MRN: [id number]  HPI:  [name] is here with several acute complaints- 1) non productive cough, clear nasal drainage, fatigue, chills, diarrhea (1-2 loose stools, denies hematochezia) - all for last 3 weeks.  He reports that several co-workers at [employer] have been ill with similar sx's.  He has been using OTC Mucinex with only minimal sx relief. He denies fever/chills/N/Vdysuria/hematuria/hematochezia 2) He reports pain over hernia repair site "when my dog stands on it", I instructed him not to let his dog stand his surgical site. 3) He reports re-occurrence of ED sx's, inability to maintain erection or reach orgasm. 4) He requires complete physical with fasting labs for new health insurance program. 5) He requires referral to GI for colonscopy He has not used tobacco in 4 weeks- GREAT! He continues to abstain from ETOH  Patient Care Team    Relationship Specialty Notifications Start End  Julaine Fusi, NP PCP - General Family Medicine  08/06/16     Patient Active Problem List   Diagnosis Date Noted  . Screening for colon cancer 03/19/2018  . Bronchitis 03/19/2018  . Erectile dysfunction 03/19/2018  . Left foot pain 05/21/2017  . Ear pain, right 05/21/2017  . 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 Healthcare maintenance 02/05/2017  . Back pain with right-sided sciatica 08/06/2016  . Headache 08/06/2016  . Tobacco use 08/06/2016  . Left inguinal hernia 08/06/2016  . Hearing loss 08/06/2016  . Poor dentition 08/06/2016     History reviewed. No pertinent past medical history.   Past Surgical History:  Procedure Laterality Date  . HERNIA REPAIR     2009 and 2012     Family History  Problem Relation Age of Onset  . Alcohol abuse Mother   . Stroke Mother        in her late 77's  . Heart attack Father        in his 86's  . Hypertension Father      Social History   Substance and  Sexual Activity  Drug Use No     Social History   Substance and Sexual Activity  Alcohol Use No     Social History   Tobacco Use  Smoking Status Current Every Day Smoker  . Packs/day: 0.50  . Years: 42.00  . Pack years: 21.00  . Types: Cigarettes  Smokeless Tobacco Never Used     Outpatient Encounter Medications as of 03/19/2018  Medication Sig  . chlorpheniramine-HYDROcodone (TUSSIONEX PENNKINETIC ER) 10-8 MG/5ML SUER Take 5 mLs by mouth every 12 (twelve) hours as needed.  . doxycycline (VIBRA-TABS) 100 MG tablet Take 1 tablet (100 mg total) by mouth 2 (two) times daily.  . fluticasone (FLONASE) 50 MCG/ACT nasal spray Place 2 sprays into both nostrils daily.  Marland Kitchen loperamide (IMODIUM) 2 MG capsule Take 1 capsule (2 mg total) by mouth as needed for diarrhea or loose stools. Max daily dose 16mg /day  . [DISCONTINUED] Acetaminophen-Caffeine (EXCEDRIN TENSION HEADACHE) 500-65 MG TABS Take by mouth.  . [DISCONTINUED] ciprofloxacin-dexamethasone (CIPRODEX) OTIC suspension Place 4 drops into the right ear 2 (two) times daily.  . [DISCONTINUED] meloxicam (MOBIC) 7.5 MG tablet Take 1 tablet (7.5 mg total) by mouth 2 (two) times daily as needed for pain.   No facility-administered encounter medications on file as of 03/19/2018.     Allergies: Patient has no known allergies.  Body mass index is 19.88 kg/m.  Blood pressure 101/62, pulse 72, temperature 98.5 F (36.9 C), temperature source Oral, height 6\' 4"  (1.93 m), weight 163 lb 4.8 oz (74.1 kg), SpO2 99 %.  Review of Systems  Constitutional: Positive for appetite change and fatigue. Negative for activity change, chills, diaphoresis, fever and unexpected weight change.  HENT: Positive for congestion, postnasal drip, sinus pressure, sinus pain and sore throat. Negative for trouble swallowing and voice change.   Eyes: Negative for visual disturbance.  Respiratory: Positive for cough. Negative for chest tightness, shortness of breath  and wheezing.   Cardiovascular: Negative for chest pain, palpitations and leg swelling.  Gastrointestinal: Positive for abdominal pain and diarrhea. Negative for abdominal distention, blood in stool, nausea and vomiting.  Genitourinary: Negative for difficulty urinating, flank pain and hematuria.  Neurological: Negative for dizziness and headaches.  Hematological: Does not bruise/bleed easily.  Psychiatric/Behavioral: Positive for sleep disturbance.       Objective:   Physical Exam  Constitutional: He is oriented to person, place, and time. He appears well-developed and well-nourished. No distress.  HENT:  Head: Normocephalic and atraumatic.  Right Ear: External ear normal. Tympanic membrane is erythematous and bulging. No decreased hearing is noted.  Left Ear: External ear normal. Tympanic membrane is erythematous and bulging. No decreased hearing is noted.  Nose: Mucosal edema and rhinorrhea present. Right sinus exhibits no maxillary sinus tenderness and no frontal sinus tenderness. Left sinus exhibits no maxillary sinus tenderness and no frontal sinus tenderness.  Mouth/Throat: Mucous membranes are dry. Abnormal dentition. Posterior oropharyngeal edema and posterior oropharyngeal erythema present. No oropharyngeal exudate. Tonsils are 0 on the right. Tonsils are 0 on the left. No tonsillar exudate.  Eyes: Pupils are equal, round, and reactive to light. Conjunctivae and EOM are normal.  Neck: Normal range of motion. Neck supple.  Cardiovascular: Normal rate, regular rhythm, normal heart sounds and intact distal pulses.  No murmur heard. Pulmonary/Chest: Effort normal and breath sounds normal. No stridor. No respiratory distress. He has no wheezes. He has no rales. He exhibits no tenderness.  Abdominal: Soft. Bowel sounds are normal. He exhibits no distension and no mass. There is tenderness in the right upper quadrant and left upper quadrant. There is no rigidity, no rebound, no guarding,  no CVA tenderness, no tenderness at McBurney's point and negative Murphy's sign. No hernia.  Lymphadenopathy:    He has no cervical adenopathy.  Neurological: He is alert and oriented to person, place, and time.  Skin: Skin is warm and dry. Capillary refill takes less than 2 seconds. No rash noted. He is not diaphoretic. No erythema. No pallor.  Psychiatric: He has a normal mood and affect. His behavior is normal. Judgment and thought content normal.  Nursing note and vitals reviewed.     Assessment & Plan:   1. Screening for colon cancer   2. Healthcare maintenance   3. Bronchitis   4. Erectile dysfunction, unspecified erectile dysfunction type     Healthcare maintenance  1) Please take Doxycycline as directed. 2) Please take Tussionex, Flonase, and Loperamide as needed. 3) Push fluids and follow BRAT diet. 4) Referral to Gastroenterologist placed, re: Colonoscopy. 5) Please call your surgeon and ask about possible complications with hernia repair. 6) Please schedule complete physical and fasting labs in 6 weeks. 7) We can further discuss ED treatment at follow-up.  Bronchitis [place] Controlled Substance Database reviewed- no aberrancies noted Tussionex and Doxycycline Remain off tobacco   Erectile dysfunction Has used Viagra/Cialis in past without SE  Pt was in the office today for 25+ minutes, I spent > 75% of  time in face to face counseling of patient's various medical conditions and in coordination of care  FOLLOW-UP:  Return in about 6 weeks (around 04/30/2018) for CPE, Fasting Labs.

## 2018-03-19 NOTE — Assessment & Plan Note (Signed)
Has used Viagra/Cialis in past without SE

## 2018-03-19 NOTE — Assessment & Plan Note (Signed)
[place] Controlled Substance Database reviewed- no aberrancies noted Tussionex and Doxycycline Remain off tobacco

## 2018-03-19 NOTE — Assessment & Plan Note (Signed)
  1) Please take Doxycycline as directed. 2) Please take Tussionex, Flonase, and Loperamide as needed. 3) Push fluids and follow BRAT diet. 4) Referral to Gastroenterologist placed, re: Colonoscopy. 5) Please call your surgeon and ask about possible complications with hernia repair. 6) Please schedule complete physical and fasting labs in 6 weeks. 7) We can further discuss ED treatment at follow-up.

## 2018-03-31 ENCOUNTER — Encounter: Payer: Self-pay | Admitting: Adult Health

## 2018-04-24 ENCOUNTER — Other Ambulatory Visit: Payer: Self-pay

## 2018-04-24 ENCOUNTER — Other Ambulatory Visit: Payer: BLUE CROSS/BLUE SHIELD

## 2018-04-24 DIAGNOSIS — Z0000 Encounter for general adult medical examination without abnormal findings: Secondary | ICD-10-CM

## 2018-04-28 NOTE — Progress Notes (Deleted)
   Subjective:    Patient ID: Steven Briggs, male    DOB: 04/27/1966, 52 y.o.   MRN: [id number]  HPI:  Steven Briggs is here for CPE  Healthcare Maintenance: Colonoscopy- Immunizations-   Patient Care Team    Relationship Specialty Notifications Start End  Julaine Fusianford, Mekenna Finau D, NP PCP - General Family Medicine  08/06/16     Patient Active Problem List   Diagnosis Date Noted  . Screening for colon cancer 03/19/2018  . Bronchitis 03/19/2018  . Erectile dysfunction 03/19/2018  . Left foot pain 05/21/2017  . Ear pain, right 05/21/2017  . 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 Healthcare maintenance 02/05/2017  . Back pain with right-sided sciatica 08/06/2016  . Headache 08/06/2016  . Tobacco use 08/06/2016  . Left inguinal hernia 08/06/2016  . Hearing loss 08/06/2016  . Poor dentition 08/06/2016     No past medical history on file.   Past Surgical History:  Procedure Laterality Date  . HERNIA REPAIR     2009 and 2012     Family History  Problem Relation Age of Onset  . Alcohol abuse Mother   . Stroke Mother        in her late 7480's  . Heart attack Father        in his 4550's  . Hypertension Father      Social History   Substance and Sexual Activity  Drug Use No     Social History   Substance and Sexual Activity  Alcohol Use No     Social History   Tobacco Use  Smoking Status Current Every Day Smoker  . Packs/day: 0.50  . Years: 42.00  . Pack years: 21.00  . Types: Cigarettes  Smokeless Tobacco Never Used     Outpatient Encounter Medications as of 04/30/2018  Medication Sig  . chlorpheniramine-HYDROcodone (TUSSIONEX PENNKINETIC ER) 10-8 MG/5ML SUER Take 5 mLs by mouth every 12 (twelve) hours as needed.  . doxycycline (VIBRA-TABS) 100 MG tablet Take 1 tablet (100 mg total) by mouth 2 (two) times daily.  . fluticasone (FLONASE) 50 MCG/ACT nasal spray Place 2 sprays into both nostrils daily.  Marland Kitchen. loperamide (IMODIUM) 2 MG capsule Take 1 capsule (2 mg  total) by mouth as needed for diarrhea or loose stools. Max daily dose 16mg /day   No facility-administered encounter medications on file as of 04/30/2018.     Allergies: Patient has no known allergies.  There is no height or weight on file to calculate BMI.  There were no vitals taken for this visit.     Review of Systems     Objective:   Physical Exam        Assessment & Plan:  No diagnosis found.  No problem-specific Assessment & Plan notes found for this encounter.    FOLLOW-UP:  No follow-ups on file.

## 2018-04-30 ENCOUNTER — Encounter: Payer: Self-pay | Admitting: Adult Health

## 2019-07-06 IMAGING — MR MR LUMBAR SPINE W/O CM
5 series · 48 of 48 positions shown · non-contrast
Comparison: Two views lumbar spine performed at [REDACTED] 12/17/2016.

CLINICAL DATA: Right side low back pain radiating into the lateral
aspect of the right thigh for 1 year. No known injury.

EXAM:
MRI LUMBAR SPINE WITHOUT CONTRAST
TECHNIQUE: Multiplanar, multisequence MR imaging of the lumbar spine was
performed. No intravenous contrast was administered.

[Series 3: T2 · sagittal · 4.0mm · 0.94mm/px · 6 of 17 slices shown (1 of 2)]
[im 1/17]
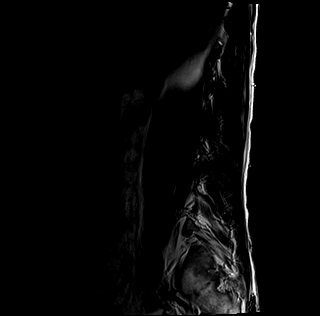
[im 4/17]
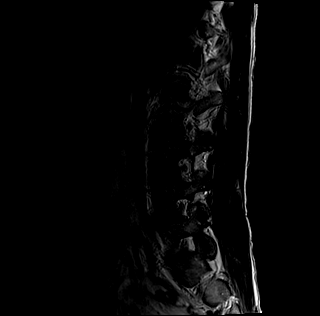
[im 7/17]
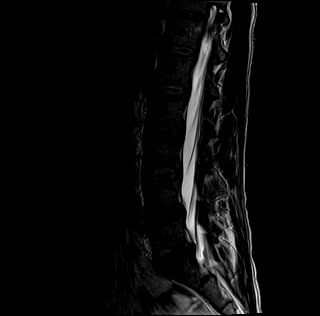
[im 10/17]
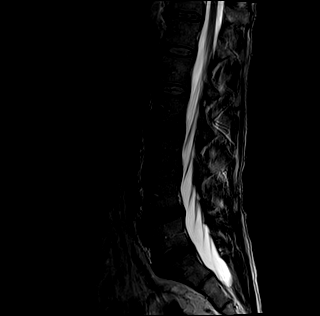
[im 13/17]
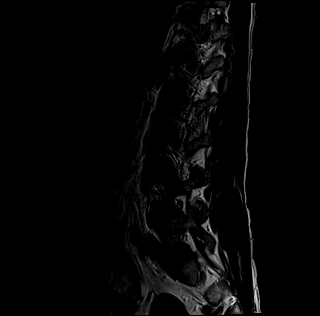
[im 17/17]
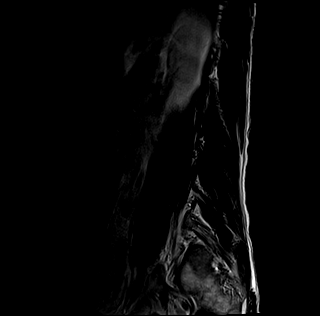

[Series 4: T1 · sagittal · 4.0mm · 0.94mm/px · 5 of 17 slices shown (1 of 2)]
[im 1/17]
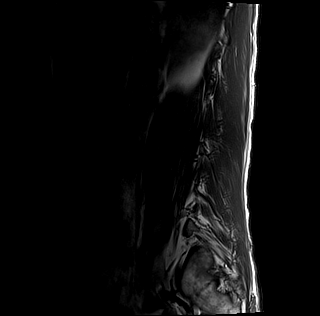
[im 5/17]
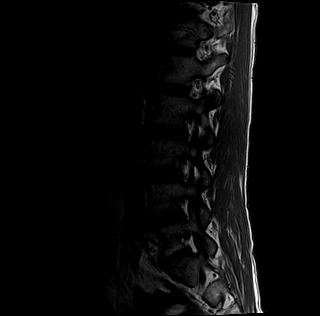
[im 9/17]
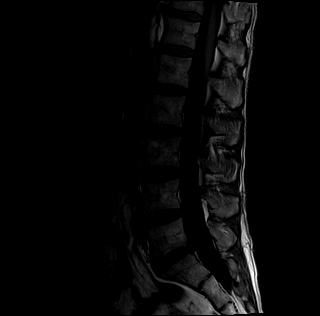
[im 13/17]
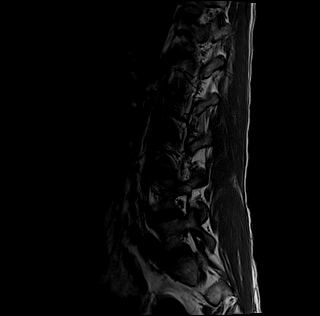
[im 17/17]
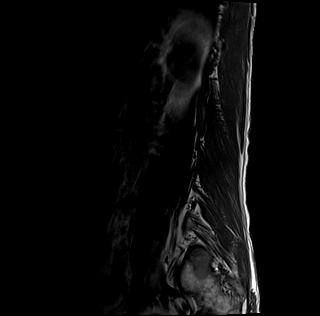

[Series 5: tirm sag · sagittal · 4.0mm · 0.59mm/px · 5 of 17 slices shown]
[im 1/17]
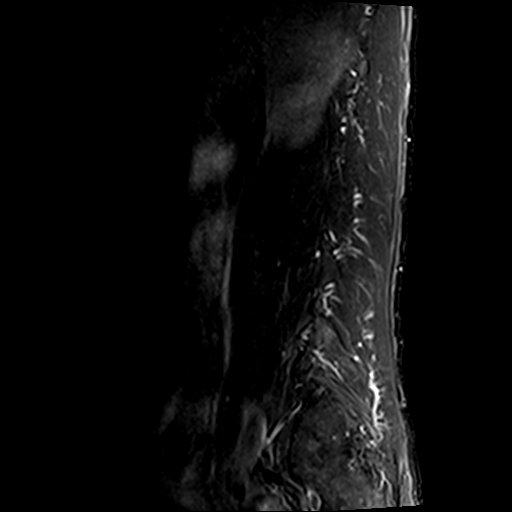
[im 5/17]
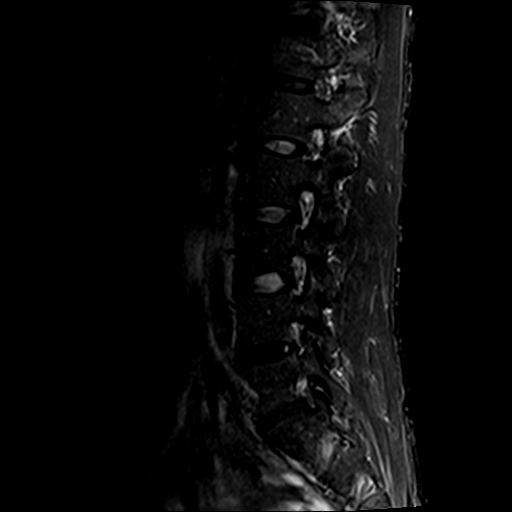
[im 9/17]
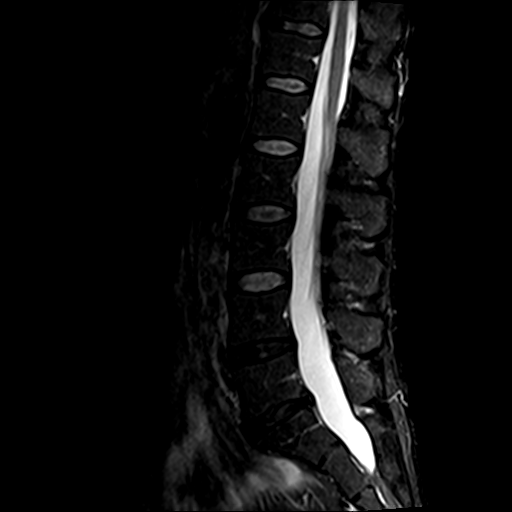
[im 13/17]
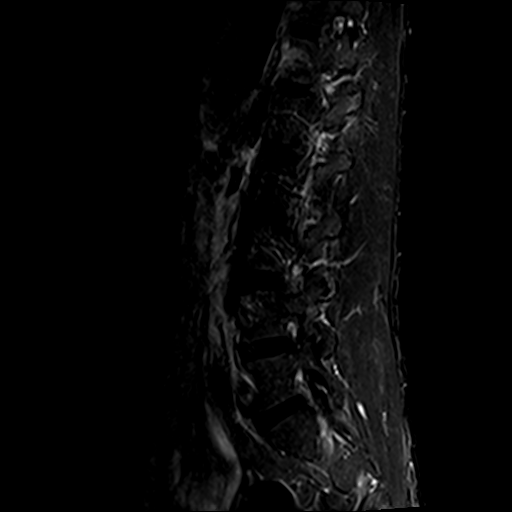
[im 17/17]
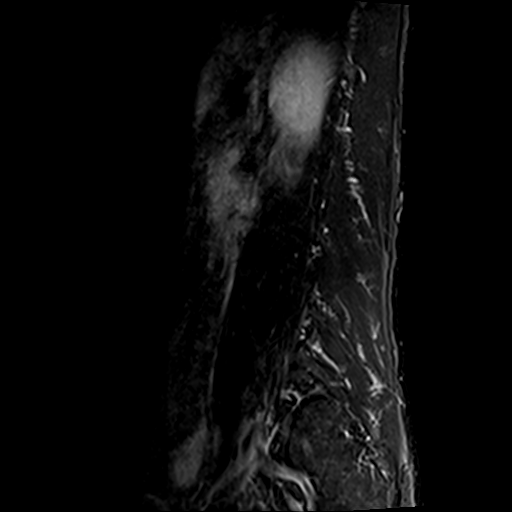

[Series 6: T1 · axial · 4.0mm · 0.78mm/px · z∈[-220,+71]mm · 16 of 55 slices shown (2 of 2)]
[im 1/55]
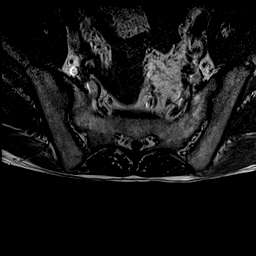
[im 4/55]
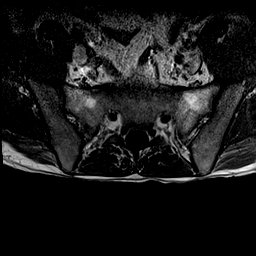
[im 8/55]
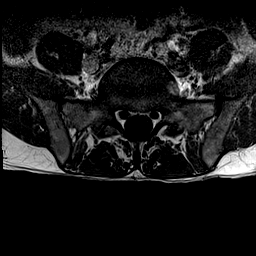
[im 11/55]
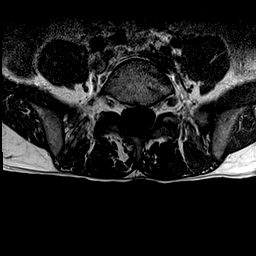
[im 15/55]
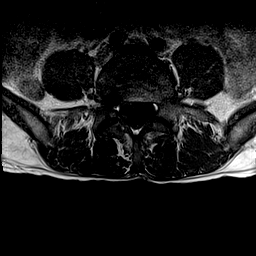
[im 19/55]
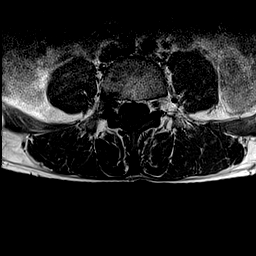
[im 22/55]
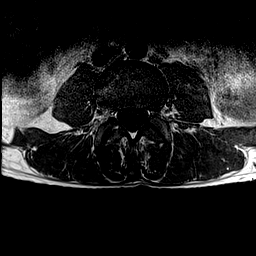
[im 26/55]
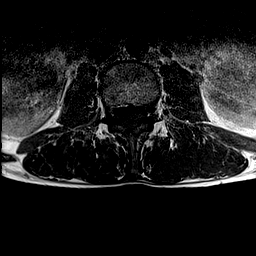
[im 29/55]
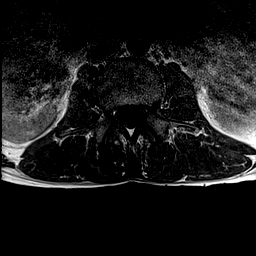
[im 33/55]
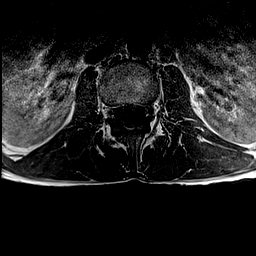
[im 37/55]
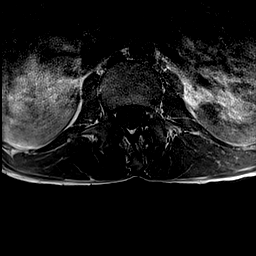
[im 40/55]
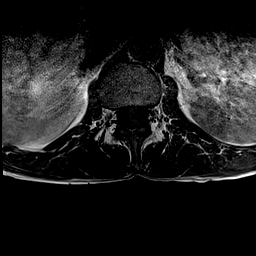
[im 44/55]
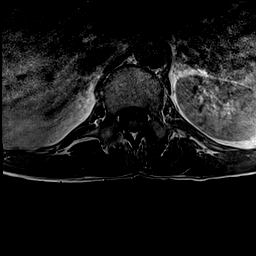
[im 47/55]
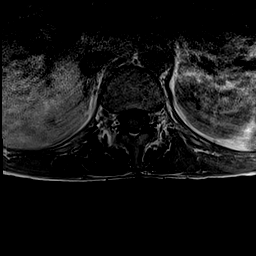
[im 51/55]
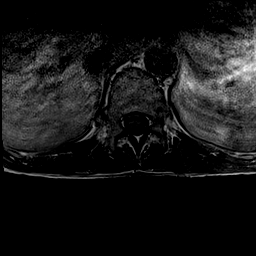
[im 55/55]
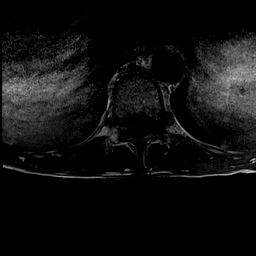

[Series 7: T2 · axial · 4.0mm · 0.78mm/px · z∈[-220,+71]mm · 16 of 55 slices shown (2 of 2)]
[im 1/55]
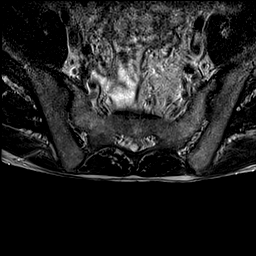
[im 4/55]
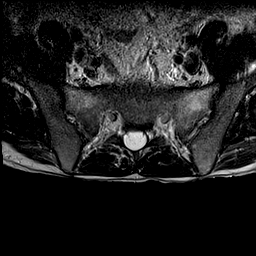
[im 8/55]
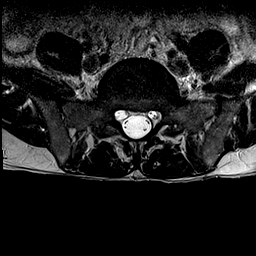
[im 11/55]
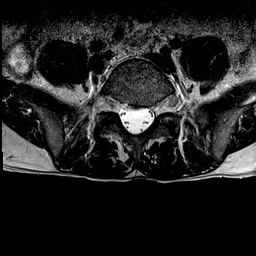
[im 15/55]
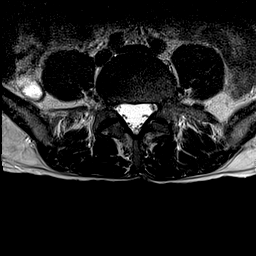
[im 19/55]
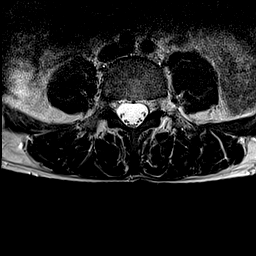
[im 22/55]
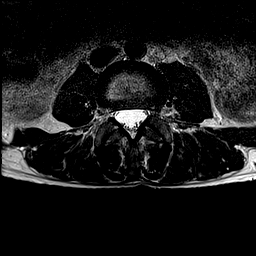
[im 26/55]
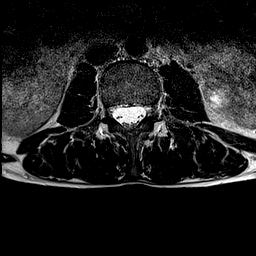
[im 29/55]
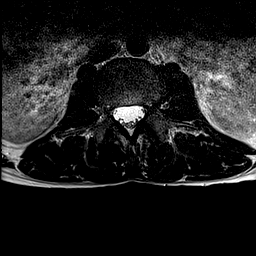
[im 33/55]
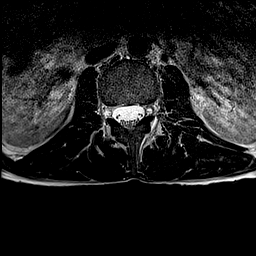
[im 37/55]
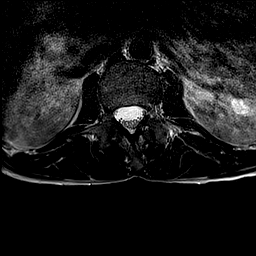
[im 40/55]
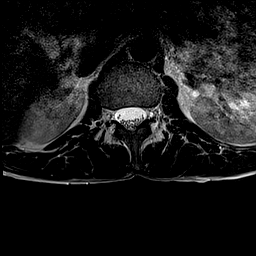
[im 44/55]
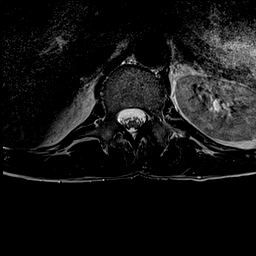
[im 47/55]
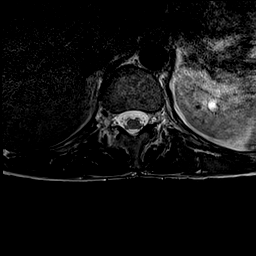
[im 51/55]
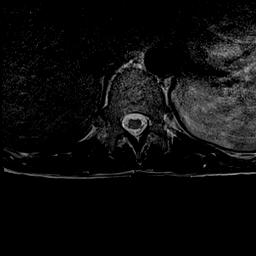
[im 55/55]
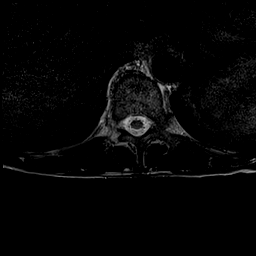

[48 of 48 positions shown; findings below may reference images not displayed]

FINDINGS: Segmentation:  Standard.

Alignment:  Maintained.

Vertebrae:  Height and signal are normal.

Conus medullaris: Extends to the L1 level and appears normal.

Paraspinal and other soft tissues: Negative.

Disc levels:

T11-12: The disc is desiccated and there is a minimal bulge without
central canal or foraminal stenosis.

T12-L1:  Negative.

L1-2:  Negative.

L2-3:  Negative.

L3-4:  Negative.

L4-5: The disc is desiccated with a small annular fissure in the
right foramen. Minimal bulge is seen but the central canal and
foramina are open.

L5-S1: Shallow right paracentral protrusion without central canal or
foraminal stenosis.
IMPRESSION: Mild degenerative disease most notable at L5-S1 where there is a
shallow right paracentral protrusion without central canal or
foraminal stenosis.

## 2019-10-06 ENCOUNTER — Ambulatory Visit
Admission: EM | Admit: 2019-10-06 | Discharge: 2019-10-06 | Disposition: A | Discharge status: Home / Self Care | Payer: BLUE CROSS/BLUE SHIELD | Attending: Physician Assistant | Admitting: Physician Assistant

## 2019-10-06 DIAGNOSIS — M25561 Pain in right knee: Secondary | ICD-10-CM | POA: Diagnosis not present

## 2019-10-06 DIAGNOSIS — L84 Corns and callosities: Secondary | ICD-10-CM

## 2019-10-06 DIAGNOSIS — L905 Scar conditions and fibrosis of skin: Secondary | ICD-10-CM

## 2019-10-06 MED ORDER — DICLOFENAC SODIUM 75 MG PO TBEC: 75.0000 mg | DELAYED_RELEASE_TABLET | Freq: Two times a day (BID) | ORAL | 0 refills | Status: DC

## 2019-10-06 NOTE — ED Provider Notes (Signed)
[facility]    CSN: [id number] Arrival date & time: 10/06/19  1103      History   Chief Complaint Chief Complaint  Patient presents with  . Hip Pain    HPI Steven Briggs is a 54 y.o. male.   54 year old male comes in for multiple complaints.  1. Right knee pain x 3 months Started after falling off a hay loft 3 months ago.  States may have a twisting motion when he fell.  Denies falling on top of the knee.  He has been able to ambulate on own.  States now with intermittent medial swelling to the right knee that is improved with rest.  Does have mild pain at rest, pain worse during certain range of motion and movement.  Denies radiation of pain, numbness, tingling.  Ibuprofen 400 mg with some relief.  2.  1 week history of left hip pain. Denies injury/trauma.  States had inguinal hernia repair 2 years ago.  Pain is along surgical scar, worse with certain movement.  States pain is worse first thing in the morning, and resolves throughout the day.  Denies increased swelling, erythema, warmth.  Denies radiation of pain.  Denies abdominal pain, nausea, vomiting, diarrhea.  Denies trouble with bowel movements.  3.  Right foot sore. States has had this in the past, was told this was due to ill fitted shoe. States wanted to make sure it is okay. Denies erythema, warmth.     History reviewed. No pertinent past medical history.  Patient Active Problem List   Diagnosis Date Noted  . Screening for colon cancer 03/19/2018  . Bronchitis 03/19/2018  . Erectile dysfunction 03/19/2018  . Left foot pain 05/21/2017  . Ear pain, right 05/21/2017  . 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 Healthcare maintenance 02/05/2017  . Back pain with right-sided sciatica 08/06/2016  . Headache 08/06/2016  . Tobacco use 08/06/2016  . Left inguinal hernia 08/06/2016  . Hearing loss 08/06/2016  . Poor dentition 08/06/2016    Past Surgical History:  Procedure Laterality Date  . HERNIA  REPAIR     2009 and 2012       Home Medications    Prior to Admission medications   Medication Sig Start Date End Date Taking? Authorizing Provider  diclofenac (VOLTAREN) 75 MG EC tablet Take 1 tablet (75 mg total) by mouth 2 (two) times daily. 10/06/19   Belinda Fisher, PA-C    Family History Family History  Problem Relation Age of Onset  . Alcohol abuse Mother   . Stroke Mother        in her late 40's  . Heart attack Father        in his 39's  . Hypertension Father     Social History Social History   Tobacco Use  . Smoking status: Current Every Day Smoker    Packs/day: 0.50    Years: 42.00    Pack years: 21.00    Types: Cigarettes  . Smokeless tobacco: Never Used  Substance Use Topics  . Alcohol use: No  . Drug use: No     Allergies   Patient has no known allergies.   Review of Systems Review of Systems  Reason unable to perform ROS: See HPI as above.     Physical Exam Triage Vital Signs ED Triage Vitals  Enc Vitals Group     BP 10/06/19 1113 114/78     Pulse Rate 10/06/19 1113 82     Resp  10/06/19 1113 18     Temp 10/06/19 1113 97.9 F (36.6 C)     Temp Source 10/06/19 1113 Oral     SpO2 10/06/19 1113 98 %     Weight --      Height --      Head Circumference --      Peak Flow --      Pain Score 10/06/19 1121 2     Pain Loc --      Pain Edu? --      Excl. in [place]? --    No data found.  Updated Vital Signs BP 114/78 (BP Location: Left Arm)   Pulse 82   Temp 97.9 F (36.6 C) (Oral)   Resp 18   SpO2 98%   Visual Acuity Right Eye Distance:   Left Eye Distance:   Bilateral Distance:    Right Eye Near:   Left Eye Near:    Bilateral Near:     Physical Exam Constitutional:      General: He is not in acute distress.    Appearance: Normal appearance. He is well-developed. He is not toxic-appearing or diaphoretic.  HENT:     Head: Normocephalic and atraumatic.  Eyes:     Conjunctiva/sclera: Conjunctivae normal.     Pupils: Pupils are  equal, round, and reactive to light.  Pulmonary:     Effort: Pulmonary effort is normal. No respiratory distress.     Comments: Speaking in full sentences without difficulty Abdominal:     General: Bowel sounds are normal.     Palpations: Abdomen is soft.     Tenderness: There is no abdominal tenderness. There is no guarding or rebound.     Comments: LLQ with surgical scar. Hard palpation at baseline due to hardware. No hernia. No erythema, warmth.   Musculoskeletal:     Cervical back: Normal range of motion and neck supple.     Comments: Left hip: no tenderness to palpation of the hip. Full ROM. Strength 5/5  Right knee: no swelling, erythema, warmth, contusion. Tenderness to palpation of medial knee. Full ROM. Strength 5/5 BLE. Sensation intact. Pedal pulse 2+  Right planter foot with callus to the distal surface with small corn. No open wound. No erythema, warmth, swelling. No tenderness to palpation.   Skin:    General: Skin is warm and dry.  Neurological:     Mental Status: He is alert and oriented to person, place, and time.      UC Treatments / Results  Labs (all labs ordered are listed, but only abnormal results are displayed) Labs Reviewed - No data to display  EKG   Radiology No results found.  Procedures Procedures (including critical care time)  Medications Ordered in UC Medications - No data to display  Initial Impression / Assessment and Plan / UC Course  I have reviewed the triage vital signs and the nursing notes.  Pertinent labs & imaging results that were available during my care of the patient were reviewed by me and considered in my medical decision making (see chart for details).    1. Right knee pain No alarming signs on exam.  NSAIDs, ice compress, knee sleeve during activity.  May need follow-up with orthopedics for further evaluation if symptoms not improving.  2. Scar tissue No tenderness to palpation of the left hip.  Pain located where  surgical scar is on the left lower quadrant.  Hard palpation/mass per patient is baseline due to hardware from inguinal repair.  No signs of cellulitis, abscess on exam.  No hernia.  Will provide symptomatic treatment for now.  To follow-up with PCP if symptoms not improving.  3. Corn  No signs of infection.  Discussed OTC salicylic acid, donut dressing for symptomatic management.  Return precautions given.  Patient expresses understanding and agrees to plan.  Final Clinical Impressions(s) / UC Diagnoses   Final diagnoses:  Acute pain of right knee  Scar tissue  Corn of foot    ED Prescriptions    Medication Sig Dispense Auth. Provider   diclofenac (VOLTAREN) 75 MG EC tablet Take 1 tablet (75 mg total) by mouth 2 (two) times daily. 20 tablet Belinda Fisher, PA-C     I have reviewed the PDMP during this encounter.   Belinda Fisher, PA-C 10/06/19 1506

## 2019-10-06 NOTE — ED Triage Notes (Signed)
Pt states fell out of a hay loft in January. States still having rt knee pain, no new injury. States having lt hip pain x1wk, no injury, pt states has a sore to rt foot. Pt states unable to get in to his PCP, sent here for evaluation.

## 2019-10-06 NOTE — Discharge Instructions (Signed)
Right knee pain Start diclofenac. Do not take ibuprofen (motrin/advil)/ naproxen (aleve) while on diclofenac. Ice compress to the area, knee brace during activity. Follow up with orthopedics for further evaluation if symptoms not improving.  Scar tissue More scar tissue pain than hip pain. Take pain medicine as above, this will also decrease inflammation. Ice compress to the area. Follow up with PCP if symptoms not improving.   Corn of foot No open wounds, no signs of infection. You can get "corn removal kit" at grocery store/pharmacy. Use donut dressing to keep pressure off of area. Follow up with PCP if symptoms not improving.

## 2019-12-28 ENCOUNTER — Ambulatory Visit
Admission: EM | Admit: 2019-12-28 | Discharge: 2019-12-28 | Disposition: A | Discharge status: Home / Self Care | Payer: BLUE CROSS/BLUE SHIELD | Attending: Emergency Medicine | Admitting: Emergency Medicine

## 2019-12-28 ENCOUNTER — Encounter: Payer: Self-pay | Admitting: Emergency Medicine

## 2019-12-28 ENCOUNTER — Other Ambulatory Visit: Payer: Self-pay

## 2019-12-28 DIAGNOSIS — K0889 Other specified disorders of teeth and supporting structures: Secondary | ICD-10-CM | POA: Diagnosis not present

## 2019-12-28 DIAGNOSIS — K0381 Cracked tooth: Secondary | ICD-10-CM | POA: Diagnosis not present

## 2019-12-28 MED ORDER — AMOXICILLIN 500 MG PO TABS: 500.0000 mg | ORAL_TABLET | Freq: Two times a day (BID) | ORAL | 0 refills | Status: AC

## 2019-12-28 NOTE — ED Triage Notes (Signed)
APP assessment prior to RN triage, please see provider note.   

## 2019-12-28 NOTE — Discharge Instructions (Signed)
Follow up with dentist. Take antibiotic as prescribed with food - important to complete course. Return for worsening pain, redness, swelling, discharge, fever.

## 2019-12-28 NOTE — ED Provider Notes (Signed)
[facility]    CSN: [id number] Arrival date & time: 12/28/19  1358      History   Chief Complaint Chief Complaint  Patient presents with  . Dental Pain    HPI Steven Briggs is a 53 y.o. male with history of tobacco use, poor dentition presenting for left upper dental pain.  States this occurred "Sunday after chewing on a popcorn kernel and cracking his tooth.  States it crown came off as well.  Has noticed increased pain and swelling since.  No fever, drooling, ear pain, facial swelling.   History reviewed. No pertinent past medical history.  Patient Active Problem List   Diagnosis Date Noted  . Screening for colon cancer 03/19/2018  . Bronchitis 03/19/2018  . Erectile dysfunction 03/19/2018  . Left foot pain 05/21/2017  . Ear pain, right 05/21/2017  . 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 Healthcare maintenance 02/05/2017  . Back pain with right-sided sciatica 08/06/2016  . Headache 08/06/2016  . Tobacco use 08/06/2016  . Left inguinal hernia 08/06/2016  . Hearing loss 08/06/2016  . Poor dentition 08/06/2016    Past Surgical History:  Procedure Laterality Date  . HERNIA REPAIR     20" 09 and 2012       Home Medications    Prior to Admission medications   Medication Sig Start Date End Date Taking? Authorizing Provider  amoxicillin (AMOXIL) 500 MG tablet Take 1 tablet (500 mg total) by mouth 2 (two) times daily for 7 days. 12/28/19 01/04/20  Hall-Potvin, 01/06/20, PA-C    Family History Family History  Problem Relation Age of Onset  . Alcohol abuse Mother   . Stroke Mother        in her late 59's  . Heart attack Father        in his 2's  . Hypertension Father     Social History Social History   Tobacco Use  . Smoking status: Current Every Day Smoker    Packs/day: 0.50    Years: 42.00    Pack years: 21.00    Types: Cigarettes  . Smokeless tobacco: Never Used  Substance Use Topics  . Alcohol use: No  . Drug use: No     Allergies    Patient has no known allergies.   Review of Systems As per HPI   Physical Exam Triage Vital Signs ED Triage Vitals  Enc Vitals Group     BP      Pulse      Resp      Temp      Temp src      SpO2      Weight      Height      Head Circumference      Peak Flow      Pain Score      Pain Loc      Pain Edu?      Excl. in [place]?    No data found.  Updated Vital Signs BP 110/75 (BP Location: Left Arm)   Pulse 73   Temp 98.2 F (36.8 C) (Oral)   Resp 16   SpO2 97%   Visual Acuity Right Eye Distance:   Left Eye Distance:   Bilateral Distance:    Right Eye Near:   Left Eye Near:    Bilateral Near:     Physical Exam Constitutional:      General: He is not in acute distress. HENT:     Head: Normocephalic and atraumatic.  Mouth/Throat:     Comments: Poor dentition with numerous fillings, dental caries.  Top left front molar cracked.  No gingival edema, erythema, fluctuance.  Mild TTP Eyes:     General: No scleral icterus.    Pupils: Pupils are equal, round, and reactive to light.  Cardiovascular:     Rate and Rhythm: Normal rate.  Pulmonary:     Effort: Pulmonary effort is normal. No respiratory distress.     Breath sounds: No wheezing.  Skin:    Coloration: Skin is not jaundiced or pale.  Neurological:     Mental Status: He is alert and oriented to person, place, and time.      UC Treatments / Results  Labs (all labs ordered are listed, but only abnormal results are displayed) Labs Reviewed - No data to display  EKG   Radiology No results found.  Procedures Procedures (including critical care time)  Medications Ordered in UC Medications - No data to display  Initial Impression / Assessment and Plan / UC Course  I have reviewed the triage vital signs and the nursing notes.  Pertinent labs & imaging results that were available during my care of the patient were reviewed by me and considered in my medical decision making (see chart for  details).     Patient afebrile, nontoxic in office today.  Will start amoxicillin, have patient follow-up with dentist (states he has 1 currently) for further management.  Return precautions discussed, patient verbalized understanding and is agreeable to plan. Final Clinical Impressions(s) / UC Diagnoses   Final diagnoses:  Pain, dental  Cracked tooth     Discharge Instructions     Follow up with dentist. Take antibiotic as prescribed with food - important to complete course. Return for worsening pain, redness, swelling, discharge, fever.    ED Prescriptions    Medication Sig Dispense Auth. Provider   amoxicillin (AMOXIL) 500 MG tablet Take 1 tablet (500 mg total) by mouth 2 (two) times daily for 7 days. 14 tablet Hall-Potvin, [place], PA-C     I have reviewed the PDMP during this encounter.   Hall-Potvin, [place], [place] 12/28/19 1559

## 2021-12-11 ENCOUNTER — Ambulatory Visit
Admission: EM | Admit: 2021-12-11 | Discharge: 2021-12-11 | Disposition: A | Discharge status: Home / Self Care | Payer: Self-pay | Attending: Internal Medicine | Admitting: Internal Medicine

## 2021-12-11 DIAGNOSIS — R0981 Nasal congestion: Secondary | ICD-10-CM

## 2021-12-11 MED ORDER — PSEUDOEPHEDRINE HCL 60 MG PO TABS: 30.0000 mg | ORAL_TABLET | ORAL | 0 refills | Status: AC | PRN

## 2021-12-11 MED ORDER — FLUTICASONE PROPIONATE 50 MCG/ACT NA SUSP: 1.0000 | Freq: Every day | NASAL | 0 refills | Status: AC
